# Patient Record
Sex: Female | Born: 1985 | Race: White | Hispanic: No | Marital: Single | Smoking: Current every day smoker
Health system: Southern US, Community
[De-identification: ages and names within clinical notes are randomized; demographics above are authoritative.]

## PROBLEM LIST (undated history)

## (undated) DIAGNOSIS — F410 Panic disorder [episodic paroxysmal anxiety] without agoraphobia: Secondary | ICD-10-CM

## (undated) DIAGNOSIS — F419 Anxiety disorder, unspecified: Secondary | ICD-10-CM

## (undated) HISTORY — PX: DILATION AND CURETTAGE OF UTERUS: SHX78

## (undated) HISTORY — DX: Panic disorder (episodic paroxysmal anxiety): F41.0

## (undated) HISTORY — DX: Anxiety disorder, unspecified: F41.9

## (undated) HISTORY — PX: TUBAL LIGATION: SHX77

---
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
## 2004-01-14 ENCOUNTER — Emergency Department (HOSPITAL_COMMUNITY): Admission: AC | Admit: 2004-01-14 | Discharge: 2004-01-14 | Payer: Self-pay

## 2006-09-01 ENCOUNTER — Emergency Department (HOSPITAL_COMMUNITY): Admission: EM | Admit: 2006-09-01 | Discharge: 2006-09-01 | Payer: Self-pay | Admitting: Emergency Medicine

## 2006-11-16 ENCOUNTER — Emergency Department (HOSPITAL_COMMUNITY): Admission: EM | Admit: 2006-11-16 | Discharge: 2006-11-16 | Payer: Self-pay | Admitting: Emergency Medicine

## 2007-04-09 ENCOUNTER — Encounter: Payer: Self-pay | Admitting: Obstetrics & Gynecology

## 2007-04-09 ENCOUNTER — Inpatient Hospital Stay (HOSPITAL_COMMUNITY): Admission: RE | Admit: 2007-04-09 | Discharge: 2007-04-11 | Payer: Self-pay | Admitting: Obstetrics & Gynecology

## 2007-09-12 ENCOUNTER — Other Ambulatory Visit: Admission: RE | Admit: 2007-09-12 | Discharge: 2007-09-12 | Payer: Self-pay | Admitting: Obstetrics and Gynecology

## 2007-11-08 ENCOUNTER — Ambulatory Visit (HOSPITAL_COMMUNITY): Admission: RE | Admit: 2007-11-08 | Discharge: 2007-11-08 | Payer: Self-pay | Admitting: Obstetrics and Gynecology

## 2007-12-11 ENCOUNTER — Ambulatory Visit (HOSPITAL_COMMUNITY): Admission: RE | Admit: 2007-12-11 | Discharge: 2007-12-11 | Payer: Self-pay | Admitting: Obstetrics and Gynecology

## 2008-01-01 ENCOUNTER — Ambulatory Visit (HOSPITAL_COMMUNITY): Admission: RE | Admit: 2008-01-01 | Discharge: 2008-01-01 | Payer: Self-pay | Admitting: Obstetrics and Gynecology

## 2008-01-15 ENCOUNTER — Ambulatory Visit (HOSPITAL_COMMUNITY): Admission: RE | Admit: 2008-01-15 | Discharge: 2008-01-15 | Payer: Self-pay | Admitting: Obstetrics and Gynecology

## 2008-01-29 ENCOUNTER — Ambulatory Visit (HOSPITAL_COMMUNITY): Admission: RE | Admit: 2008-01-29 | Discharge: 2008-01-29 | Payer: Self-pay | Admitting: Obstetrics and Gynecology

## 2008-02-05 ENCOUNTER — Emergency Department (HOSPITAL_BASED_OUTPATIENT_CLINIC_OR_DEPARTMENT_OTHER): Admission: EM | Admit: 2008-02-05 | Discharge: 2008-02-05 | Payer: Self-pay | Admitting: Emergency Medicine

## 2008-02-19 ENCOUNTER — Ambulatory Visit (HOSPITAL_COMMUNITY): Admission: RE | Admit: 2008-02-19 | Discharge: 2008-02-19 | Payer: Self-pay | Admitting: Obstetrics and Gynecology

## 2008-03-13 ENCOUNTER — Inpatient Hospital Stay (HOSPITAL_COMMUNITY): Admission: AD | Admit: 2008-03-13 | Discharge: 2008-03-13 | Payer: Self-pay | Admitting: Family Medicine

## 2008-03-18 ENCOUNTER — Encounter: Payer: Self-pay | Admitting: Obstetrics & Gynecology

## 2008-03-18 ENCOUNTER — Ambulatory Visit: Payer: Self-pay | Admitting: Obstetrics & Gynecology

## 2008-03-18 ENCOUNTER — Inpatient Hospital Stay (HOSPITAL_COMMUNITY): Admission: RE | Admit: 2008-03-18 | Discharge: 2008-03-20 | Payer: Self-pay | Admitting: Obstetrics and Gynecology

## 2008-03-24 ENCOUNTER — Inpatient Hospital Stay (HOSPITAL_COMMUNITY): Admission: AD | Admit: 2008-03-24 | Discharge: 2008-03-28 | Payer: Self-pay | Admitting: Obstetrics & Gynecology

## 2008-03-24 ENCOUNTER — Encounter: Payer: Self-pay | Admitting: Obstetrics & Gynecology

## 2008-03-24 ENCOUNTER — Ambulatory Visit: Payer: Self-pay | Admitting: Family Medicine

## 2009-06-30 ENCOUNTER — Emergency Department (HOSPITAL_BASED_OUTPATIENT_CLINIC_OR_DEPARTMENT_OTHER): Admission: EM | Admit: 2009-06-30 | Discharge: 2009-06-30 | Payer: Self-pay | Admitting: Emergency Medicine

## 2010-03-28 ENCOUNTER — Encounter: Payer: Self-pay | Admitting: Obstetrics and Gynecology

## 2010-05-25 LAB — URINALYSIS, ROUTINE W REFLEX MICROSCOPIC
Bilirubin Urine: NEGATIVE
Glucose, UA: NEGATIVE mg/dL
Ketones, ur: NEGATIVE mg/dL
Leukocytes, UA: NEGATIVE
Nitrite: NEGATIVE
Protein, ur: NEGATIVE mg/dL
Specific Gravity, Urine: 1.026 (ref 1.005–1.030)
Urobilinogen, UA: 0.2 mg/dL (ref 0.0–1.0)
pH: 6 (ref 5.0–8.0)

## 2010-05-25 LAB — URINE MICROSCOPIC-ADD ON

## 2010-06-03 ENCOUNTER — Emergency Department (HOSPITAL_BASED_OUTPATIENT_CLINIC_OR_DEPARTMENT_OTHER)
Admission: EM | Admit: 2010-06-03 | Discharge: 2010-06-03 | Disposition: A | Discharge status: Home / Self Care | Payer: Medicaid Other | Attending: Emergency Medicine | Admitting: Emergency Medicine

## 2010-06-03 ENCOUNTER — Emergency Department (INDEPENDENT_AMBULATORY_CARE_PROVIDER_SITE_OTHER): Payer: Medicaid Other

## 2010-06-03 DIAGNOSIS — K7689 Other specified diseases of liver: Secondary | ICD-10-CM | POA: Insufficient documentation

## 2010-06-03 DIAGNOSIS — R1011 Right upper quadrant pain: Secondary | ICD-10-CM | POA: Insufficient documentation

## 2010-06-03 DIAGNOSIS — F17200 Nicotine dependence, unspecified, uncomplicated: Secondary | ICD-10-CM | POA: Insufficient documentation

## 2010-06-03 DIAGNOSIS — R110 Nausea: Secondary | ICD-10-CM

## 2010-06-03 DIAGNOSIS — R112 Nausea with vomiting, unspecified: Secondary | ICD-10-CM | POA: Insufficient documentation

## 2010-06-03 LAB — COMPREHENSIVE METABOLIC PANEL
ALT: 43 U/L — ABNORMAL HIGH (ref 0–35)
AST: 31 U/L (ref 0–37)
Albumin: 3.8 g/dL (ref 3.5–5.2)
Alkaline Phosphatase: 59 U/L (ref 39–117)
BUN: 11 mg/dL (ref 6–23)
CO2: 24 mEq/L (ref 19–32)
Calcium: 8.8 mg/dL (ref 8.4–10.5)
Chloride: 110 mEq/L (ref 96–112)
Creatinine, Ser: 0.5 mg/dL (ref 0.4–1.2)
GFR calc Af Amer: >60 mL/min (ref >60)
GFR calc non Af Amer: >60 mL/min (ref >60)
Glucose, Bld: 93 mg/dL (ref 70–99)
Potassium: 3.4 mEq/L — ABNORMAL LOW (ref 3.5–5.1)
Sodium: 143 mEq/L (ref 135–145)
Total Bilirubin: 0.4 mg/dL (ref 0.3–1.2)
Total Protein: 6.9 g/dL (ref 6.0–8.3)

## 2010-06-03 LAB — CBC
HCT: 38.2 % (ref 36.0–46.0)
Hemoglobin: 13.4 g/dL (ref 12.0–15.0)
MCH: 29.3 pg (ref 26.0–34.0)
MCHC: 35.1 g/dL (ref 30.0–36.0)
MCV: 83.4 fL (ref 78.0–100.0)
Platelets: 362 10*3/uL (ref 150–400)
RBC: 4.58 MIL/uL (ref 3.87–5.11)
RDW: 14.5 % (ref 11.5–15.5)
WBC: 10.6 10*3/uL — ABNORMAL HIGH (ref 4.0–10.5)

## 2010-06-03 LAB — DIFFERENTIAL
Basophils Absolute: 0 10*3/uL (ref 0.0–0.1)
Basophils Relative: 0 % (ref 0–1)
Eosinophils Absolute: 0.1 10*3/uL (ref 0.0–0.7)
Eosinophils Relative: 1 % (ref 0–5)
Lymphocytes Relative: 16 % (ref 12–46)
Lymphs Abs: 1.7 10*3/uL (ref 0.7–4.0)
Monocytes Absolute: 0.6 10*3/uL (ref 0.1–1.0)
Monocytes Relative: 5 % (ref 3–12)
Neutro Abs: 8.2 10*3/uL — ABNORMAL HIGH (ref 1.7–7.7)
Neutrophils Relative %: 78 % — ABNORMAL HIGH (ref 43–77)

## 2010-06-03 LAB — URINALYSIS, ROUTINE W REFLEX MICROSCOPIC
Bilirubin Urine: NEGATIVE
Glucose, UA: NEGATIVE mg/dL
Hgb urine dipstick: NEGATIVE
Ketones, ur: NEGATIVE mg/dL
Nitrite: NEGATIVE
Protein, ur: NEGATIVE mg/dL
Specific Gravity, Urine: 1.019 (ref 1.005–1.030)
Urobilinogen, UA: 1 mg/dL (ref 0.0–1.0)
pH: 6 (ref 5.0–8.0)

## 2010-06-03 LAB — LIPASE, BLOOD: Lipase: 45 U/L (ref 23–300)

## 2010-06-03 LAB — PREGNANCY, URINE: Preg Test, Ur: NEGATIVE

## 2010-06-21 LAB — CROSSMATCH: ABO/RH(D): A POS

## 2010-06-21 LAB — PREPARE FRESH FROZEN PLASMA

## 2010-06-21 LAB — CBC
HCT: 23.2 % — ABNORMAL LOW (ref 36.0–46.0)
HCT: 27.8 % — ABNORMAL LOW (ref 36.0–46.0)
HCT: 15.2 % — ABNORMAL LOW (ref 36.0–46.0)
HCT: 22.1 % — ABNORMAL LOW (ref 36.0–46.0)
HCT: 22.7 % — ABNORMAL LOW (ref 36.0–46.0)
Hemoglobin: 7.7 g/dL — CL (ref 12.0–15.0)
Hemoglobin: 9.5 g/dL — ABNORMAL LOW (ref 12.0–15.0)
Hemoglobin: 7.5 g/dL — CL (ref 12.0–15.0)
MCHC: 33.2 g/dL (ref 30.0–36.0)
MCHC: 34.2 g/dL (ref 30.0–36.0)
MCHC: 33.3 g/dL (ref 30.0–36.0)
MCHC: 33.8 g/dL (ref 30.0–36.0)
MCHC: 33.9 g/dL (ref 30.0–36.0)
MCHC: 34.3 g/dL (ref 30.0–36.0)
MCHC: 34.4 g/dL (ref 30.0–36.0)
MCV: 89.5 fL (ref 78.0–100.0)
MCV: 88.8 fL (ref 78.0–100.0)
MCV: 89.1 fL (ref 78.0–100.0)
MCV: 89.8 fL (ref 78.0–100.0)
MCV: 90.4 fL (ref 78.0–100.0)
Platelets: 312 10*3/uL (ref 150–400)
Platelets: 132 10*3/uL — ABNORMAL LOW (ref 150–400)
Platelets: 160 10*3/uL (ref 150–400)
Platelets: 184 10*3/uL (ref 150–400)
RBC: 2.58 MIL/uL — ABNORMAL LOW (ref 3.87–5.11)
RBC: 3.11 MIL/uL — ABNORMAL LOW (ref 3.87–5.11)
RBC: 2.55 MIL/uL — ABNORMAL LOW (ref 3.87–5.11)
RBC: 2.6 MIL/uL — ABNORMAL LOW (ref 3.87–5.11)
RDW: 13.6 % (ref 11.5–15.5)
RDW: 13.8 % (ref 11.5–15.5)
RDW: 13.6 % (ref 11.5–15.5)
RDW: 13.8 % (ref 11.5–15.5)
WBC: 10.6 10*3/uL — ABNORMAL HIGH (ref 4.0–10.5)
WBC: 13.1 10*3/uL — ABNORMAL HIGH (ref 4.0–10.5)
WBC: 9 10*3/uL (ref 4.0–10.5)
WBC: 9.8 10*3/uL (ref 4.0–10.5)

## 2010-06-21 LAB — APTT
aPTT: 32 seconds (ref 24–37)
aPTT: 33 seconds (ref 24–37)
aPTT: 45 seconds — ABNORMAL HIGH (ref 24–37)

## 2010-06-21 LAB — BASIC METABOLIC PANEL
GFR calc Af Amer: >60 mL/min (ref >60)
GFR calc non Af Amer: >60 mL/min (ref >60)
Potassium: 3.5 mEq/L (ref 3.5–5.1)
Sodium: 139 mEq/L (ref 135–145)

## 2010-06-21 LAB — FIBRINOGEN
Fibrinogen: 195 mg/dL — ABNORMAL LOW (ref 204–475)
Fibrinogen: 366 mg/dL (ref 204–475)
Fibrinogen: <60 mg/dL — CL (ref 204–475)

## 2010-06-21 LAB — PREPARE RBC (CROSSMATCH)

## 2010-06-21 LAB — PROTIME-INR: INR: 1.1 (ref 0.00–1.49)

## 2010-06-21 LAB — RPR: RPR Ser Ql: NONREACTIVE

## 2010-07-20 NOTE — Discharge Summary (Signed)
Jasmine Knapp, Jasmine Knapp              ACCOUNT NO.:  [id number]   MEDICAL RECORD NO.:  [id number]          PATIENT TYPE:  INP   LOCATION:  9145                          FACILITY:  [facility]   PHYSICIAN:  Norton Blizzard, MD    DATE OF BIRTH:  Aug 03, 1985   DATE OF ADMISSION:  03/18/2008  DATE OF DISCHARGE:  03/20/2008                               DISCHARGE SUMMARY   ADMISSION DIAGNOSES:  1. Intrauterine pregnancy at 48 and 4 weeks' gestation.  2. History of previous cesarean sections x2.  3. Undesired fertility.   DISCHARGE DIAGNOSES:  Postoperative day #2 from repeat low transverse  cesarean section and bilateral tubal ligation.   PROCEDURE:  Repeat low transverse cesarean section bilateral tubal  ligation performed by Dr. Duane Lope on March 18, 2008.   COMPLICATIONS:  None.   CONSULTATIONS:  None.   LABORATORY DATA:  A positive with antibody screen negative.  RPR was  nonreactive.  CBC on postoperative day #1 showed white blood count of  9.3, hemoglobin of 7.7, hematocrit 23.2, and platelet count 257.   ADMISSION HISTORY:  Jasmine Knapp is a 25 year old gravida 3, para 3-0-0-3  with history of previous cesarean sections x2.  She presented to the  [facility] in [place] for a repeat low transverse cesarean  section as she does not desire trial of labor.   HOSPITAL COURSE:  The patient was admitted and given Ancef 1 g for  infection prophylaxis.  She was then taken for a repeat low transverse  cesarean section and bilateral tubal ligation performed by Dr. Duane Lope on March 18, 2008.  The operation was uncomplicated though  moderate adhesive disease was noted throughout the rectus muscles,  peritoneum, and uterus.  After a 6 pounds 10 ounces baby girl was  delivered, attention was turned to the bilateral tubal ligation.  Due to  adhesive disease of the right fallopian tube,  sterilization of the  right fallopian tube was performed via a fibriomectomy.  Sterilization  of the left fallopian tube was performed via a Pomeroy procedure.  The  patient did well postoperatively.  On postoperative day #1, the patient  was ambulating well, tolerating oral intake and had a decrease in lochia  and vital signs were stable.  Notably, the hemoglobin postoperative day  #1 was 7.7, and iron supplementation was started at 325 mg twice a day.  Physical exam was normal.  On postoperative day #2, the patient desired  early discharge and was discharged in stable condition.   DISCHARGE DIET:  Stable.   DISCHARGE MEDICATIONS:  1. Percocet 5/325 mg 1 tablet by mouth every 4-6 hours as needed for      pain.  2. Ibuprofen 600 mg 1 tablet by mouth every 6 hours as needed for      pain.  3. Colace 100 mg 1 tablet by mouth twice a day as needed for      postpartum constipation.  4. Ferrous sulfate 325 mg 1 tablet by mouth twice a day.   DISCHARGE INSTRUCTIONS:  1. Discharged to home.  2. Regular diet.  3. No  sexual activity or anything entering the vagina for 6 weeks.  4. No lifting greater than 10 pounds for 6 weeks.  5. The patient is to follow up with Dr. Emelda Fear at [place], [facility], for staple removal on March 24, 2008, and at 6 weeks      for her postpartum exam.      Delbert Harness, MD      Norton Blizzard, MD  Electronically Signed    KB/MEDQ  D:  03/20/2008  T:  03/20/2008  Job:  [id number]

## 2010-07-20 NOTE — Discharge Summary (Signed)
[name], Jasmine Knapp              ACCOUNT NO.:  [id number]   MEDICAL RECORD NO.:  [id number]          PATIENT TYPE:  INP   LOCATION:  9308                          [facility]  [facility]   PHYSICIAN:  Tilda Burrow, M.D. DATE OF BIRTH:  March 04, 1986   DATE OF ADMISSION:  03/24/2008  DATE OF DISCHARGE:  03/28/2008                               DISCHARGE SUMMARY   ADMITTING DIAGNOSES:  Late postpartum hemorrhage and anemia.   PROCEDURE:  Transfusion, 8 units packed cells transfusion, 2 units fresh  frozen plasma, postpartum dilation and curettage  performed on March 24, 2008.   DISCHARGE MEDICATIONS:  1. Ibuprofen 600 mg, #30, 1 p.o. q.6 h. p.r.n. pain.  2. Percocet 5/325, 20 tablets, 1 p.o. q.6 h. p.r.n. severe pain.  3. K-Dur 20 mEq 1 p.o. q.a.m. x2 weeks.  4. HCTZ 25 mg, 15 tablets, 1 p.o. q.a.m. x2 weeks.  5. Iron sulfate 60 tablets 1 p.o. b.i.d. x30 days.   FOLLOWUP:  Follow up in one week with [facility].   HOSPITAL SUMMARY:  This is a 25 year old female on postpartum day 6  after a straightforward cesarean section with postpartum hemoglobin of  7.5.  On postop day 1, after a pre-cesarean hemoglobin of 9.7, was  admitted after calling [facility] on the morning of March 24, 2008, complaining of increased bleeding.  She could not come to the  office and presented approximately 5 p.m. to [facility] where  hemoglobin of 5.1 was identified, hematocrit 15.2, and white count  13,000.  IV oxytocin was initiated and uterine tone was good with  increased clots in the uterus.  She was transfused 2 units with  hemoglobin of 7.8 initially post procedure.  She was taken to the  operating room for curettage, which did not identify any placental  remnants, but removed large amounts of clots.  She was responded to  oxytocics and curettage adequately.  She was kept in the ICU with  vigorous resuscitation fluid and blood replacement management over the  first 24 hours.   Hemoglobin on January 19, was 7.7, rechecked, at 7.5  later that day.  After collaboration with hemoglobin 6.6 and hematocrit  19.4, a 2 units of  additional blood given on March 26, 2008, two  additional units packed cells.  Postoperative hemoglobin was 10.0.  She  had shortness of breath and chest x-ray on March 27, 2008, showed mild  pulmonary edema, which responded to Lasix.  Repeat chest x-ray March 28, 2008, showed improvements.  She was unlabored with oxygen saturation  of 97%, the patient considered discharge and stable for discharge home  at that time.   ADDENDUM:  On reviewing records, the patient had a Bakri balloon  overnight after the uterine curettage with good response.      Tilda Burrow, M.D.  Electronically Signed    JVF/MEDQ  D:  03/28/2008  T:  03/29/2008  Job:  [id number]

## 2010-07-20 NOTE — Consult Note (Signed)
Jasmine Knapp, Jasmine Knapp              ACCOUNT NO.:  [id number]   MEDICAL RECORD NO.:  [id number]          PATIENT TYPE:  INP   LOCATION:  9373                          [facility]   PHYSICIAN:  Tanya S. Shawnie Pons, M.D.   DATE OF BIRTH:  04/24/1985   DATE OF CONSULTATION:  DATE OF DISCHARGE:                                 CONSULTATION   HISTORY OF PRESENT ILLNESS:  The patient is a 25 year old gravida 3,  para 3 who is 6 days status post repeat low transverse cesarean section  and BTL who had been discharged home on March 20, 2008, after that  procedure.  Postoperatively, she has done well.  She reports her  bleeding was good until today when she began to acutely hemorrhage.  She  apparently soaked 4 pads en route to this hospital and when she got  here, she was very pale, diaphoretic, and had very few and had very  little in the way of venous access.  The patient was seen in the MAU  where she was acutely evaluated, was found to be probably very anemic.  Her postoperative hemoglobin had been 7.7 five days prior to admission,  and it was unclear where her hemoglobin was as she was obviously still  losing quite a bit of blood.  She had a very enlarged uterus that felt  to be full of clot.  She received Methergine, Pitocin, and Cytotec in  the MAU without significant improvement in her physical exam or output,  so she was taken for emergency surgery.   PAST MEDICAL HISTORY:  Negative.   PAST SURGICAL HISTORY:  C-section x3 and BTL.   ALLERGIES:  None known.   MEDICATIONS:  1. Prenatal vitamins.  2. Percocet for pain.  3. Iron tablets 1 twice a day.  4. Colace 1 twice a day.  5. Ibuprofen 600 mg as needed for pain.   OBSTETRICAL HISTORY:  She has had 3 cesarean sections, also BTL.   GYNECOLOGICAL HISTORY:  Noncontributory.   REVIEW OF SYSTEMS:  Unable to be obtained at the time of admission.   PHYSICAL EXAMINATION:  GENERAL:  The patient is pale, diaphoretic.  She  is in  steep Trendelenburg.  VITAL SIGNS:  Blood pressure is 90/40, pulse is in the 110s.  LUNGS:  Essentially clear.  CARDIAC:  Heart rate is regular with a significant flow murmur noted.  ABDOMEN:  Soft.  The fundus is approximately 1 cm above the umbilicus  and boggy.  Cervix is open, approximately 1 cm with lots of clot noted  at the internal os.  EXTREMITIES:  Cool and clammy.  NEUROLOGICAL:  The patient is intact and answering questions.   PERTINENT LABORATORY DATA:  Could not be obtained.   IMPRESSION:  Acute bleeding, 6 days post cesarean section, profound  anemia, and blood loss shock.   PLAN:  Immediate surgical intervention with a D&C and further  intervention as needed.  The patient was also given 2 units of emergency  blood, and we will continue to try to obtain labs.      Shelbie Proctor. Shawnie Pons, M.D.  Electronically Signed     TSP/MEDQ  D:  03/24/2008  T:  03/25/2008  Job:  [id number]

## 2010-07-20 NOTE — Op Note (Signed)
[name], Jasmine Knapp              ACCOUNT NO.:  [id number]   MEDICAL RECORD NO.:  [id number]          PATIENT TYPE:  INP   LOCATION:  9126                          [facility]  [facility]   PHYSICIAN:  Lazaro Arms, M.D.   DATE OF BIRTH:  14-Nov-1985   DATE OF PROCEDURE:  04/09/2007  DATE OF DISCHARGE:                               OPERATIVE REPORT   PREOPERATIVE DIAGNOSES:  1. Uterine pregnancy at [redacted] weeks gestation.  2. Previous C-section.  3. Declines trial of labor.   POSTOPERATIVE DIAGNOSES:  1. Uterine pregnancy at [redacted] weeks gestation.  2. Previous C-section.  3. Declines trial of labor.   PROCEDURE:  Repeat cesarean section.   SURGEON:  Lazaro Arms, M.D.   ANESTHESIA:  Spinal.   FINDINGS:  Over a low transverse hysterotomy incision was delivered a  viable female infant at 10:53 with Apgars of 8 and 9. The weight was 6  pounds 0 ounces, three-vessel cord, cord blood and cord gas were sent.  There were dense adhesions band of the anterior abdominal wall to the  uterus otherwise uterus, tubes and ovaries normal.   DESCRIPTION OF OPERATION:  The patient was taken to the operating room,  underwent a spinal anesthetic then placed in the supine position with a  tilt to the left side.  She was prepped and draped in the usual sterile  fashion. A Foley catheter was placed. A Pfannenstiel's skin incision was  made, carried down sharply to the rectus fascia, scored in the midline  and extended laterally. The fascia was taken off the muscles superiorly  and inferiorly without difficulty.  The muscles were divided, peritoneal  cavity was entered. At that time dense adhesions basically all the way  to the fundus of the uterus were encountered.  These were taken down  sharply with a knife avoiding the posterior wall of the bladder. So  basically it took a little uterine serosa with it in order to avoid any  bladder damage. This was all the way down to the lower uterine segment.  A lower  transverse incision was then made.  The baby was delivered  without difficulty using a vacuum extractor. The infant was taken to the  neonatologist who was in attendance for routine neonatal resuscitation.  The placenta was delivered spontaneously.  Cord blood and cord gas were  sent.  The cord gas had a pH 7.34. The uterus was closed with two  layers, the first being a running interlocking layer, the second being  an imbricating layer with good hemostasis. Where I took the adhesion of  the uterus was then closed with interrupted Monocryl sutures in figure-  of-eight fashion without difficulty.  There was good hemostasis at this  point. The muscles were reapproximated, the fascia was closed using zero  Vicryl running, subcutaneous  tissues was made hemostatic and irrigated.  The skin was closed using  skin staples.  The patient tolerated the procedure well.  She  experienced 600 mL of blood loss,  taken to the recovery room in stable  condition.  All counts correct x3.  Lazaro Arms, M.D.  Electronically Signed     LHE/MEDQ  D:  04/09/2007  T:  04/09/2007  Job:  [id number]

## 2010-07-20 NOTE — Op Note (Signed)
Jasmine Knapp, Jasmine Knapp              ACCOUNT NO.:  [id number]   MEDICAL RECORD NO.:  [id number]          PATIENT TYPE:  INP   LOCATION:  9145                          [facility]   PHYSICIAN:  Lazaro Arms, M.D.   DATE OF BIRTH:  February 28, 1986   DATE OF PROCEDURE:  DATE OF DISCHARGE:                               OPERATIVE REPORT   PREOPERATIVE DIAGNOSES:  1. Intrauterine pregnancy at term.  2. History of previous cesarean section x2.  3. Undesired fertility.   POSTOPERATIVE DIAGNOSES:  1. Intrauterine pregnancy at term.  2. History of previous cesarean section x2.  3. Undesired fertility.   PROCEDURE:  1. Repeat low transverse cesarean section.  2. Bilateral tubal sterilization procedure.   SURGEON:  Lazaro Arms, MD   ASSISTANT:  Odie Sera, DO   ANESTHESIA:  Spinal.   INDICATIONS FOR PROCEDURE:  [name] is a 25 year old, gravida  3, now para 3-0-0-3, who presents at 39-4/7th weeks gestational age for  an elective repeat cesarean section.  She also desires a tubal  sterilization procedure.  She has previously been counseled on the risks  and benefits of both procedures to include, but not limited to bleeding,  infection, damage to internal organs as well as the failure rate of  approximately 5 in 1000 with the increased risk of a tubal pregnancy in  the event if failure did occur.  She has also been extensively counseled  on the permanency of this procedure.  She voiced understanding of the  above and desires to proceed with the procedures.   DESCRIPTION OF PROCEDURE:  The patient was taken to the operating room  where spinal anesthesia was introduced.  She was then prepped and draped  in the usual sterile manner and placed in the left dorsal supine  position.  Time-out was conducted.  Appropriate anesthesia was  confirmed.  A Pfannenstiel incision was made with a scalpel and  continued down to the fascia.  The fascia was then incised in the  midline  and the fascial incision was extended laterally using the Mayo  scissors.  The fascia was then dissected off the underlying rectus  muscles and moderate adhesive disease was noted.  The rectus muscles  were moderately adhesed to the peritoneum.  The peritoneum was  moderately adhesed to the uterus as well.  The rectus muscles were  entered bluntly in the midline.  The peritoneum was also entered bluntly  as well.  The rectus opening was extended using a Mayo scissors  inferiorly.  The peritoneum opening was also extended inferiorly with  the Mayo scissors.  A moderate-sized adhesion of the peritoneum to the  anterior uterine wall was noted, and this was taken down with a Mayo  scissors.  Appropriate entry to the uterus was obtained, and an incision  was made in the scalpel in the lower uterine segment.  The incision was  carefully carried through the myometrium was the last layer being  penetrated bluntly with a finger.  The uterine incision was then  extended laterally with manual traction.  The head was elevated out  of  the pelvis and delivered through the uterine incision with the  assistance of fundal pressure.  The mouth and nares were bulb suctioned.  The baby was noted to be in the occiput posterior position.  The  shoulders were delivered in the usual manner followed by rest of the  corpus without problem.  The mouth and nares were bulb suctioned again.  The cord was clamped and cut x2, and the baby was handed to the awaiting  NICU staff.  The placenta then delivered spontaneously intact, and a  three-vessel cord was noted.  The uterus was exteriorized and then the  uterine incision was closed.  Cord pH was collected which was 7.3.  The  uterine incision was then closed in the usual manner with a running  interlocking 0 Monocryl suture.  Good hemostasis was noted.  Attention  was then brought to the fallopian tubes.  There was significant adhesive  disease of the right fallopian  tube to the wall the uterus.  Because a  portion of the fallopian tube could not be isolated, the tubal  sterilization procedure on the right side was performed via a  fimbriectomy.  The tube was grasped with a Kelly clamp just proximal to  the fimbria.  The tube was then oversewn in this region with Monocryl  suture.  The fimbria was then completely dissected with Metzenbaum  scissors.  The dissected portion of fimbria was sent to pathology.  Attention was then brought to the left fallopian tube.  A portion of the  tube was grasped with a Babcock clamp, and the tubal ligation was  performed using the modified Pomeroy procedure in the usual manner.  A  portion of the left fallopian tube was dissected with Metzenbaum  scissors and sent to pathology.  A small area of oozing was noted on the  anterior uterine wall in the area where the adhesion was taken down.  This area was treated with a figure-of-eight using 0 Monocryl suture.  Good hemostasis was then noted in both of that area as well as the  uterine incision.  The uterus was then returned to the abdomen.  The  abdomen was irrigated copiously with sterile water.  The fascia was then  closed in the usual manner using 0 Monocryl suture.  The subcutaneous  tissues were irrigated.  Good hemostasis was noted.  There were no  defects noted in the fascial closure.  The skin was then closed with  staples in the usual manner and a pressure dressing was applied.   FINDINGS:  1. Clear amniotic fluid.  2. Viable female infant, weight 6 pounds 12 ounces.  3. Moderate pelvic adhesive disease.   SPECIMENS:  1. Placenta.  2. Right fimbria.  3. Left fallopian tube portion.   DISPOSITION OF SPECIMENS:  To pathology.   ESTIMATED BLOOD LOSS:  700 mL.   COMPLICATIONS:  None immediate.   The patient was taken to the PACU in good condition.      Odie Sera, DO  Electronically Signed     ______________________________  Lazaro Arms,  M.D.    MC/MEDQ  D:  03/18/2008  T:  03/19/2008  Job:  [id number]

## 2010-07-20 NOTE — Consult Note (Signed)
NAMEANNELISE, Jasmine Knapp              ACCOUNT NO.:  [id number]   MEDICAL RECORD NO.:  [id number]          PATIENT TYPE:  EMS   LOCATION:  ED                            FACILITY:  [facility]   PHYSICIAN:  Tilda Burrow, M.D. DATE OF BIRTH:  01-03-1986   DATE OF CONSULTATION:  09/01/2006  DATE OF DISCHARGE:                                 CONSULTATION   HISTORY OF PRESENT ILLNESS:  This 25 year old gravida 2, para 1, LMP 7  weeks ago, is seen in the ER for right lower quadrant discomfort.  She  has had no bleeding, spotting, syncope or abdominal discomfort.  She  complains of right lower quadrant discomfort for the last 3-4 days.  Evaluation by Ms. Triplett, P.A., has been performed and documented  elsewhere.  She is seen by me in consultation.   Abdomen is nontender, is without guarding or rebound, there is vague 1+  right lower quadrant discomfort.   Transabdominal ultrasound is attempted by Dr. Jannifer Franklin and is  unsuccessful due to transmission in empty bladder.  Early gestation is  suspected but cannot be confirmed.  Transvaginal ultrasound is performed  which shows an anteflexed uterus with a single intrauterine gestational  sac with yolk sac clearly visible and a 3 mm fetal pole with fetal heart  motion noted, consistent with approximately 5-1/2 to [redacted] weeks gestation.  The left adnexa is completely normal with small ovary seen.  The right  ovary has a suspected corpus luteum cyst of 3 cm diameter in a 4.5 cm  ovary.  Palpation of the abdomen over the cyst confirms that this is the  source of her discomfort.   IMPRESSION:  Right lower quadrant discomfort associated with normal  corpus luteum cyst.  Intrauterine pregnancy 5-1/2 to [redacted] weeks gestation.   ADDENDUM:  The patient states she normally has 5-week cycles which would  correlate with her EDC being approximately 1 week later than would be  anticipated on menstrual history.   Routine followup as previously scheduled in  [facility] September 05, 2006.      Tilda Burrow, M.D.  Electronically Signed     JVF/MEDQ  D:  09/01/2006  T:  09/01/2006  Job:  [id number]   cc:   [facility]

## 2010-07-20 NOTE — H&P (Signed)
Jasmine Knapp, JUNIO              ACCOUNT NO.:  [id number]   MEDICAL RECORD NO.:  [id number]          PATIENT TYPE:  INP   LOCATION:  9126                          [facility]ACILITY[facility]   PHYSICIAN:  Lazaro Arms, M.D.   DATE OF BIRTH:  02-20-1986   DATE OF ADMISSION:  04/09/2007  DATE OF DISCHARGE:                              HISTORY & PHYSICAL   HISTORY OF PRESENT ILLNESS:  Jasmine Knapp is a 25 year old white female,  gravida 2, para 1, at [redacted] weeks gestation today, who is admitted for a  repeat cesarean section.  She had a previous C-section three years ago  and declines a trial of labor.  She had an emergency C-section at that  time for fetal distress.  Pregnancy has been relatively uncomplicated.  She has had kidney stones and is Group B strep positive, otherwise is  negative.   PAST MEDICAL HISTORY:  Negative.   PAST SURGICAL HISTORY:  C-section.   PAST OBSTETRICAL HISTORY:  C-section in 2005, three and a half years  ago, 3 pounds 9 and 1/2 ounces, emergency for fetal distress.   ALLERGIES:  None.   MEDICATIONS:  Prenatal vitamins.   REVIEW OF SYSTEMS:  Negative.   PHYSICAL EXAMINATION:  HEENT:  Unremarkable.  Thyroid is normal.  LUNGS:  Clear.  HEART:  Regular rate and rhythm without regurg, or gallop.  BREASTS:  Deferred.  ABDOMEN:  Fundal height of 36-cm.  PELVIC:  Cervix is long thick and closed.  EXTREMITIES:  Warm with no edema.  NEUROLOGIC:  Grossly intact.   Blood type is A positive.  Varicella is immune.  Rubella immune.  Urine  drug screen is positive for THC x1.  Hepatitis B was negative.  HIV was  nonreactive x2.  HSV-2 was negative.  HPV was negative.  Serology was  nonreactive x2.  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  AFP was normal.  Group B strep positive.  Glucola 131.   IMPRESSION:  1. Intrauterine pregnancy at 63 weeks' gestation.  2. Previous cesarean section.  3. Declines trial of labor.   PLAN:  The  patient is admitted for a repeat cesarean section.  She  understands the risks, benefits, indications, alternatives and will  proceed.      Lazaro Arms, M.D.  Electronically Signed     LHE/MEDQ  D:  04/09/2007  T:  04/09/2007  Job:  [id number]

## 2010-07-20 NOTE — Op Note (Signed)
NAMEDINA, WARBINGTON              ACCOUNT NO.:  [id number]   MEDICAL RECORD NO.:  [id number]          PATIENT TYPE:  INP   LOCATION:  9373                          FACILITY:  [facility]   PHYSICIAN:  Tanya S. Shawnie Pons, M.D.   DATE OF BIRTH:  [birth year]   DATE OF PROCEDURE:  DATE OF DISCHARGE:                               OPERATIVE REPORT   PREOPERATIVE DIAGNOSES:  Acute bleeding, 6 days' post cesarean section,  possible retained products of conception, and shock.   POSTOPERATIVE DIAGNOSES:  Acute bleeding, 6 days' post cesarean section,  possible retained products of conception, and shock.   PROCEDURE:  D and C, backbleed balloon placement, and vaginal packing.   SURGEON:  Shelbie Proctor. Shawnie Pons, MD   ASSISTANT:  Zerita Boers, NM   ANESTHESIA:  Spinal.   Findings include enlarged uterus, open os approximately 1 cm, uterus  full of blood, and moderate amount of tissue removed.   SPECIMEN:  Uterine contents to Pathology.   Estimated blood loss was approximately 1500 mL total in the OR.   COMPLICATIONS:  None immediately known.   REASON FOR PROCEDURE:  Briefly, the patient is a 25 year old, gravida 3,  para 3, who is 6 days' post cesarean section and BTL, who came in today  with acute vaginal bleeding and blood loss.  She was found to be  profoundly shocky and very anemic as well.  She is very pale,  diaphoretic, and had unstable vital signs, and was taken for immediate  intervention.   PROCEDURE:  The patient was taken to the OR, where she was given spinal  analgesia.  After that, she was prepped and draped in the usual sterile  fashion.  Red rubber catheter was used to drain her bladder.  Two units  of packed cells were hung and rapidly infused.  Two more units were  ordered plus FFP.  The uterus was further dilated and then sounded to  approximately 12.  A 10-French curved suction curette was passed about 6  times with lots of blood and tissue being removed.  The trap had to be  changed at least 3 times as it continued to get full and suction no  longer worked.  Following this, sharp curettage with a large banjo-type  curette was done several times and alternating between suction curette  and sharp curettage several times.  The instruments were removed from  the vagina, and bimanual massage showed fairly firm uterus.  The patient  was given Methergine again as well as Cytotec in the OR; however, she  continued to have a fairly significant bleed from the uterus.  It  appeared that the patient might be in VSD if she was not clotting, so  FFP was ordered; however, the patient still had not gotten blood  obtained as she had very little in the way of venous access.  However,  given that sharp and suction curettage could not adequately stop the  bleeding and that she likely had inability to clot, it was felt she  would best be served by placement of a backbleed balloon.  This was  placed  under direct visualization and 480 mL were used to blow up the  balloon.  Initially, quite a bit of blood came back out of the tube;  however, once the balloon was fully inflated, very little blood came  back out.  The fundus was then marked right at the umbilicus, so we  could see if it was expanding.  The backbleed balloon was hooked up to a  Foley catheter to see how much blood was coming back out of the uterus.  The vagina was packed with 2-inch packing to make sure the balloon  stayed in place, and a Foley catheter was placed inside the bladder.  The patient was stable in terms of vital signs on the way out of the OR  and several more units will be given in PACU.  Additionally, several  units of FFP will be done, and blood was essentially drawn; however,  results of this are unavailable at the time of this dictation.  These  labs will be sent, and the patient will be sent to ICU.  All instrument  and lap counts were correct x2.  The patient was awake and taken to  recovery in  stable condition.      Shelbie Proctor. Shawnie Pons, M.D.  Electronically Signed     TSP/MEDQ  D:  03/24/2008  T:  03/25/2008  Job:  [id number]

## 2010-07-23 NOTE — Discharge Summary (Signed)
Jasmine Knapp, Jasmine Knapp              ACCOUNT NO.:  [id number]   MEDICAL RECORD NO.:  [id number]          PATIENT TYPE:  INP   LOCATION:  9126                          [facility]ACILITY:  [facility]   PHYSICIAN:  Karlton Lemon, MD      DATE OF BIRTH:  Jan 28, 1986   DATE OF ADMISSION:  04/09/2007  DATE OF DISCHARGE:  04/11/2007                               DISCHARGE SUMMARY   ADMISSION DIAGNOSES:  1. Intrauterine pregnancy at 78 weeks' gestation.  2. History of previous cesarean section.  3. Declines trial of labor.   DISCHARGE DIAGNOSIS:  Postoperative day #2 from repeat low transverse  cesarean section.   PROCEDURES:  The patient had a repeat low transverse cesarean section  performed by Dr. Duane Lope on April 09, 2007.   COMPLICATIONS:  None.   CONSULTATIONS:  None.   PERTINENT LABORATORY FINDINGS:  The patient on admission had complete  blood count showing a white blood cell count of 12.8, hemoglobin 11.4,  hematocrit 32.0, and platelets 354.  Type and screen was A positive with  antibody screen negative.  RPR was nonreactive.  CBC on postoperative  day #1 showed hemoglobin 9.0, hematocrit 25.3, platelets 258, and white  blood cell count 11.7.  On postoperative day #2, she had a repeat CBC  showing hemoglobin 9.2 hematocrit 26.6, platelets 295, and white blood  cells 10.8.   BRIEF PERTINENT ADMISSION HISTORY:  Jasmine Knapp is a 25 year old gravida  2, para 1-0-0-1 with history of previous C-section x1.  She presents for  repeat low transverse cesarean section as she does not desire trial of  labor.   HOSPITAL COURSE:  The patient was admitted, taken for repeat low  transverse cesarean section by Dr. Duane Lope on April 09, 2007.  The  operation was uncomplicated though adhesions were noted densely all the  way to the uterine fundus.  The patient did well postoperatively, and on  postoperative day #2, vitals were stable, she was ambulating, tolerating  p.o., and had decreased lochia.   Physical examination was normal.  She  desired early discharge on postoperative day #2 and was discharged in  stable condition.   DISCHARGE DIET:  Stable.   DISCHARGE MEDICATIONS:  1. Percocet 5/325 one tablet every 6 hours as needed for pain.  2. Prenatal vitamins 1 tablet by mouth daily.  3. Ibuprofen 600 mg 1 tablet every 6 hours as needed for pain.  4. Colace 100 mg 1 tablet by mouth twice daily.  5. The patient desires Ortho Evra patch for birth control.   DISCHARGE INSTRUCTIONS:  1. Discharged to home.  2. Regular diet.  3. No sexual activity or anything in the vagina for 6 weeks.  4. No lifting greater than 10 pounds for 6 weeks.  5. The patient is to follow up with [facility] for staple      removal and at 6 weeks for postpartum examination.      Karlton Lemon, MD  Electronically Signed     NS/MEDQ  D:  06/07/2007  T:  06/08/2007  Job:  [id number]

## 2010-07-23 NOTE — Discharge Summary (Signed)
NAME:  Jasmine Knapp, Jasmine Knapp                        ACCOUNT NO.:  [id number]   MEDICAL RECORD NO.:  [id number]                   PATIENT TYPE:  INP   LOCATION:  A428                                 FACILITY:  [facility]   PHYSICIAN:  Tilda Burrow, M.D.              DATE OF BIRTH:  [birth year]   DATE OF ADMISSION:  08/08/2003  DATE OF DISCHARGE:  08/12/2003                                 DISCHARGE SUMMARY   ADMISSION DIAGNOSIS:  Pregnancy 36 weeks, 5 days. Nonreassuring fetal status  with prolonged deceleration.   DISCHARGE DIAGNOSES:  1. Pregnancy 36 weeks, 5 days, delivered.  2. Intrauterine growth retardation, uncertain fetal status.   PROCEDURE:  Emergent primary low transverse cervical cesarean section by  Langley Gauss.   FOLLOW UP:  One week staple removal in our office.   HOSPITAL SUMMARY:   HISTORY OF PRESENT ILLNESS:  This 25 year old prima gravida at [redacted] weeks  gestation was admitted after being evaluated by Dr. Langley Gauss on the  evening of August 08, 2003 after presenting to labor and delivery complaining  of a dark brown discharge.  Fetal monitoring showed baseline of 140  beats/minute with normal long term variability with criteria for  acceleration as noted but a deep prolonged deceleration noted to 70  beats/minute with gradual return to baseline during fetal monitoring.  She  was therefore taken for contraction stress test at that time.  She was  evaluated further and recommended for cesarean delivery.  The patient was  taken for cesarean section on the evening of admission August 08, 2003.  An  emergent  cesarean section was performed delivering a 3 pound 9 ounce female  infant with Dr. Milford Cage as pediatrician.  There was findings of nuchal cord x2.  Infant Apgar of 5 and 9 were assigned.  Estimated blood loss was 600 mL.  Pathology report shows a 355 g placenta described as mature with mild acute  chorionitis with multiple placental infarcts, less than 10% of placenta  volume involved.   Postoperatively, the patient did well, tolerated a regular diet within two  days.  Had a postoperative hemoglobin of 11.8, hematocrit 33.8 and  subsequent hemoglobin of 10.5 and 29.7, hematocrit on subsequent postop day.  The arterial blood gas at time of delivery was recorded at 7.280, Pco2 60,  Po2 6.0.  Bicarbonate level was 27.5.   The infant has required routine postop care.  Has grown well since delivery  and patient was discharged to hotel status on August 12, 2003 for followup in  five days in our office for staple removal.     ___________________________________________                                         Tilda Burrow, M.D.   JVF/MEDQ  D:  08/21/2003  T:  08/22/2003  Job:  [id number]

## 2010-07-23 NOTE — Op Note (Signed)
NAME:  Jasmine Knapp, Jasmine Knapp                        ACCOUNT NO.:  [id number]   MEDICAL RECORD NO.:  [id number]                   PATIENT TYPE:  INP   LOCATION:  A428                                 FACILITY:  [facility]   PHYSICIAN:  Langley Gauss, M.D.                DATE OF BIRTH:  Aug 13, 1985   DATE OF PROCEDURE:  08/09/2003  DATE OF DISCHARGE:                                 OPERATIVE REPORT   DIAGNOSES:  1. A 37-week intrauterine pregnancy  2. Spontaneous variable decelerations.   PROCEDURES:  Emergent, though not STAT primary low transverse cesarean  section delivered as a 3 pound 9 ounce female infant.   SURGEON:  Langley Gauss, M.D.   ESTIMATED BLOOD LOSS:  600 ml.   ANESTHESIA:  Spinal.   COMPLICATIONS:  None.   SPECIMENS:  Arterial cord gas and cord blood to pathology laboratory.  Placenta examined and noted to be apparently intact with a three-vessel  umbilical cord, although the umbilical cord is noted to be short.   Additional findings at time of delivery include a thin umbilical cord with a  taut nuchal cord x 2 which is reduced.  In addition, diagnosis of  intrauterine growth restriction is made following delivery.  Drains:  Foley  catheter was placed to straight drainage.  A Jackson-Pratt catheter was  placed in the subcutaneous space.   SUMMARY:  The patient presented p.m. of August 08, 2003, complaining of brown  discharge only.  She was noted to have a variable type deceleration on  external fetal monitor; thus, a contraction stress test was performed.  The  CST was noted to be equivocal with no late decelerations noted, although  some variable type decelerations were noted.  Thus at that point in time,  the plan was patient was continued on continuous electronic fetal monitoring  throughout the evening, and the patient was to be scheduled for a  biophysical profile and an ultrasound to be performed at [facility] a.m. of August 09, 2003, to assess fetal  growth.   Subsequently during the period of monitoring during the evening, the patient  abruptly began having recurrence of the more severe variable type  decelerations down to 80 to 90 beats per minute with one as low as 60  lasting upwards of 45 seconds to 1 minute.  These were noted to be occurring  in the absence of uterine activity.  Thus decision was made to proceed with  emergent primary low transverse cesarean section.   The patient was hurriedly readied.  She was taken down to the operating room  where a spinal analgesic was administered without complications.  The  patient was placed on the OR table at slight left lateral tilt, prepped and  draped in usual sterile manner.  After assurance of adequate surgical  analgesia, knife was used to incise Pfannenstiel incision through the skin,  dissecting down to the fascial plan.  Fascia then incised in transverse  curvilinear manner utilizing the Mayo scissors.  This then bluntly dissected  off the underlying rectus muscles.  Rectus muscles bluntly separately.  Peritoneal cavity atraumatically bluntly entered.  Peritoneal incision  extended superiorly and inferiorly.  Inferior bladder blade is placed.  Lower uterine segment identified.  Bladder flap is created from  vesicouterine fold.  Bladder flap is then bluntly separated the neovascular  plane, pushing it down below the uterine segment.  A knife is then used to  score a low transverse uterine incision.  Intact amniotic sac and __________  are in the midline.  Index finger used to extend the uterine incision  bilaterally.  Ellis clamp is used to artificially rupture the membranes  finding clear amniotic fluid.  Fetal vertex flexed and elevated to the level  of the uterine incision.  Disposable suction connected to wall suction and  placed on infant's vertex.  Gentle traction applied and combined with fundal  pressure results in easy delivery.  Mouth and nares bulb suctioned of  clear  amniotic fluid.  Nuchal cord x 2 is reduce.  Remainder of the infant  likewise delivered without complication.  The umbilical cord is towards the  infant.  Cord is doubly clamped and cut.  Infant is handed to awaiting  pediatrician, Dr. Vivia Ewing.  Arterial cord gas and cord blood obtained.  Gentle traction on umbilical cord results in separation. What appears to be  intact three-vessel placenta, though noted to be small, and is sent to  pathology laboratory.  Uterus is exteriorized.  Anterior exploration reveals  no retained placenta fragments.  Incision is not extended.  This is easily  closed utilizing 0 chromic in running locked fashion, second layer being an  imbricating layer.  Tubes and ovaries noted to be normal in appearance.  Cul-  de-sac irrigated free of all clots.  Uterus returned to the pelvic cavity.  Sponge and instrument counts correct x 2 at this point.  Peritoneum is  closed with a continuous running 0 chromic suture.  Fascia is closed with a  continuous running #1 PDS suture.  Jackson-Pratt drain is placed in  subcutaneous space.  Sutures in place, three horizontal mattress sutures, #1  PDS placed to help bring together skin edges.  The skin is then completely  closed utilizing skin staples.  The patient continues to drain clear yellow  urine.  Operative findings discussed with the patient's awaiting family on  the fourth floor.  Dr. Vivia Ewing in attendance for the operative  procedure.      ___________________________________________                                            Langley Gauss, M.D.   DC/MEDQ  D:  08/10/2003  T:  08/10/2003  Job:  [id number]

## 2010-07-23 NOTE — H&P (Signed)
NAME:  Jasmine Knapp, Jasmine Knapp                        ACCOUNT NO.:  [id number]   MEDICAL RECORD NO.:  [id number]                   PATIENT TYPE:  OIB   LOCATION:  A428                                 FACILITY:  [facility]   PHYSICIAN:  Langley Gauss, M.D.                DATE OF BIRTH:  November 07, 1985   DATE OF ADMISSION:  08/08/2003  DATE OF DISCHARGE:                                HISTORY & PHYSICAL   This is an 25 year old gravida 1, para 0 at 36-5/[redacted] weeks gestation who  presents to [facility] with a chief complaint of a dark brown  discharge.  She has received prenatal care through the office of [facility].  She does have a history of positive Chlamydia, which was treated.  She did require treatment with Reglan and Zantac during the pregnancy.  She  also had an outbreak of .  She denied the use of steroids for this  condition.  The patient subsequently has no other medical or surgical  history.  She has had serial ultrasounds which have documented that of  adequate fetal growth with no findings of placenta previa.  I was only able  to locate an ultrasound, which had been done at [redacted] weeks gestation.   PHYSICAL EXAMINATION:  VITAL SIGNS:  98.5, 80, 18, 125/73.  GENERAL:  Patient is placed on the external fetal monitor.  Noted is fetal  heart rate baseline of 140 with normal long-term variability.  She nearly  meets criteria for acceleration but subsequently in the absence of uterine  activity was noted to have an apparent fetal heart rate deceleration, very  slow in its descent, with a nadir down to 70 beats per minute and a return  to the baseline.  There may or may not be uterine activity present during  this course.   ASSESSMENT/PLAN:  Patient with fetal heart rate deceleration on external  fetal monitor.  Around discharge, I will examine her cervix.  Subsequently,  the patient clearly would be indicated for performance of a contraction  stress test at this time, and if a  contraction stress test is positive, can  proceed with delivery.     ___________________________________________                                         Langley Gauss, M.D.   DC/MEDQ  D:  08/08/2003  T:  08/08/2003  Job:  [id number]

## 2010-07-23 NOTE — H&P (Signed)
NAME:  Jasmine Knapp, Jasmine Knapp                        ACCOUNT NO.:  [id number]   MEDICAL RECORD NO.:  [id number]                   PATIENT TYPE:  INP   LOCATION:  A428                                 FACILITY:  [facility]   PHYSICIAN:  Langley Gauss, M.D.                DATE OF BIRTH:  1985-06-24   DATE OF ADMISSION:  08/08/2003  DATE OF DISCHARGE:                                HISTORY & PHYSICAL   An 25 year old gravida 1, para 0, at [redacted] weeks gestation presents to [facility] with chief complaint of dark brown discharge.  She denies any  significant uterine contractions. She does, however, state that she had  decreased fetal movement throughout today.  The patient's prenatal course by  patient report has been uncomplicated.  She does state that she had an  ultrasound done three weeks ago at which time she was informed that  everything was fine.  She has had no previous visits to labor and delivery.   She is noted to be GBS negative.  She is known to have a history of positive  Chlamydia which was treated.  Has taken Reglan and Zantac for reflux-type  symptoms, also has suffered constipation, has experienced __________ during  the pregnancy.   PHYSICAL EXAMINATION:  GENERAL:  No acute distress.  VITAL SIGNS:  See nursing flow chart.  HEENT:  Negative.  NECK:  No adenopathy.  Neck is supple, nonpalpable.  LUNGS:  Clear.  CARDIOVASCULAR:  Regular rate and rhythm.  ABDOMEN:  Soft and nontender.  Vertex presentation by Leopold's maneuvers.  Fundal height 35 cm.  PELVIC:  External genitalia within normal limits.  No bleeding noted.  No  leakage of fluid.  Examination reveals cervix to be 1 cm dilated, -2  station, about 50% effaced.  The cervix is noted to be far posterior.  EXTREMITIES:  Normal.   IMPRESSION:  External fetal monitor:  No uterine activity identified.  There  is a reactive nonstress test with normal long-term variability; however,  noted on the initial monitor strip is  a fetal heart rate deceleration to 80  to 90 beats per minute x 45 seconds duration.  This does not appear to be  associated with any uterine activity.  Thus a contraction stress test was  called for.   The Pitocin was initiated at 2 ml per minute, increased every 30 minutes to  achieve a contraction pattern of 3 in 10 minutes time.  The patient did not  have any late decelerations during the CST.  She did, however, have some  variable type decelerations.  Thus, interpretation is that of equivocal  contraction stress test.   PLAN:  Continue the patient on external fetal monitor throughout the  evening.  Ultrasound, BTT, and gestational age to be performed by department  of radiology on August 09, 2003.     ___________________________________________  Langley Gauss, M.D.   DC/MEDQ  D:  08/10/2003  T:  08/10/2003  Job:  [id number]   cc:   [facility]

## 2010-11-26 LAB — TYPE AND SCREEN
ABO/RH(D): A POS
Antibody Screen: NEGATIVE

## 2010-11-26 LAB — CBC
MCHC: 34.7
MCHC: 35.5
Platelets: 258
RBC: 2.88 — ABNORMAL LOW
RBC: 3.49 — ABNORMAL LOW
RDW: 13.6
WBC: 10.8 — ABNORMAL HIGH
WBC: 12.8 — ABNORMAL HIGH

## 2010-11-26 LAB — ABO/RH: ABO/RH(D): A POS

## 2010-11-26 LAB — RPR: RPR Ser Ql: NONREACTIVE

## 2010-12-07 ENCOUNTER — Emergency Department (HOSPITAL_BASED_OUTPATIENT_CLINIC_OR_DEPARTMENT_OTHER)
Admission: EM | Admit: 2010-12-07 | Discharge: 2010-12-07 | Disposition: A | Discharge status: Home / Self Care | Payer: Self-pay | Attending: Emergency Medicine | Admitting: Emergency Medicine

## 2010-12-07 ENCOUNTER — Encounter: Payer: Self-pay | Admitting: *Deleted

## 2010-12-07 ENCOUNTER — Emergency Department (INDEPENDENT_AMBULATORY_CARE_PROVIDER_SITE_OTHER): Payer: Self-pay

## 2010-12-07 DIAGNOSIS — R0989 Other specified symptoms and signs involving the circulatory and respiratory systems: Secondary | ICD-10-CM

## 2010-12-07 DIAGNOSIS — J40 Bronchitis, not specified as acute or chronic: Secondary | ICD-10-CM | POA: Insufficient documentation

## 2010-12-07 DIAGNOSIS — R059 Cough, unspecified: Secondary | ICD-10-CM | POA: Insufficient documentation

## 2010-12-07 DIAGNOSIS — R070 Pain in throat: Secondary | ICD-10-CM

## 2010-12-07 DIAGNOSIS — J189 Pneumonia, unspecified organism: Secondary | ICD-10-CM

## 2010-12-07 DIAGNOSIS — R05 Cough: Secondary | ICD-10-CM

## 2010-12-07 MED ORDER — AZITHROMYCIN 250 MG PO TABS
500.0000 mg | ORAL_TABLET | Freq: Once | ORAL | Status: AC
  Administered 2010-12-07: 500 mg via ORAL
  Filled 2010-12-07: qty 2

## 2010-12-07 MED ORDER — AZITHROMYCIN 250 MG PO TABS: 250.0000 mg | ORAL_TABLET | Freq: Every day | ORAL | Status: AC

## 2010-12-07 MED ORDER — HYDROCOD POLST-CHLORPHEN POLST 10-8 MG/5ML PO LQCR: 5.0000 mL | Freq: Two times a day (BID) | ORAL | Status: DC

## 2010-12-07 NOTE — ED Notes (Signed)
Cough. URI x 2 days. Is not getting better. Panic attacks x the past week.

## 2010-12-07 NOTE — ED Provider Notes (Signed)
History     CSN: [id number] Arrival date & time: 12/07/2010  6:45 PM  Chief Complaint  Patient presents with  . URI    (Consider location/radiation/quality/duration/timing/severity/associated sxs/prior treatment) HPI Comments: Pt states that she has had a cold for 2 weeks and then a cough started the last couple of days:pt states that she is getting panic attacks more frequently  Patient is a 25 y.o. female presenting with URI. The history is provided by the patient.  URI The primary symptoms include sore throat and cough. Primary symptoms do not include nausea or vomiting. The current episode started more than 1 week ago. This is a new problem. The problem has not changed since onset. Symptoms associated with the illness include congestion. The illness is not associated with sinus pressure.    History reviewed. No pertinent past medical history.  History reviewed. No pertinent past surgical history.  No family history on file.  History  Substance Use Topics  . Smoking status: Current Everyday Smoker  . Smokeless tobacco: Not on file  . Alcohol Use: Yes    OB History    Grav Para Term Preterm Abortions TAB SAB Ect Mult Living                  Review of Systems  HENT: Positive for congestion and sore throat. Negative for sinus pressure.   Respiratory: Positive for cough.   Gastrointestinal: Negative for nausea and vomiting.  All other systems reviewed and are negative.    Allergies  Review of patient's allergies indicates no known allergies.  Home Medications  No current outpatient prescriptions on file.  BP 115/76  Pulse 102  Temp(Src) 98.3 F (36.8 C) (Oral)  Resp 22  SpO2 99%  Physical Exam  Nursing note and vitals reviewed. Constitutional: She is oriented to person, place, and time. She appears well-developed and well-nourished.  HENT:  Head: Normocephalic and atraumatic.  Right Ear: External ear normal.  Left Ear: External ear normal.  Nose:  Rhinorrhea present.  Mouth/Throat: Posterior oropharyngeal erythema present.  Eyes: Pupils are equal, round, and reactive to light.  Cardiovascular: Normal rate and regular rhythm.   Pulmonary/Chest: Effort normal and breath sounds normal.  Abdominal: Soft. Bowel sounds are normal.  Musculoskeletal: Normal range of motion.  Neurological: She is alert and oriented to person, place, and time.  Skin: Skin is warm and dry.  Psychiatric: She has a normal mood and affect.    ED Course  Procedures (including critical care time)  Labs Reviewed - No data to display Dg Chest 2 View  12/07/2010  *RADIOLOGY REPORT*  Clinical Data: Cough and congestion.  CHEST - 2 VIEW  Comparison: 03/28/2008  Findings: There is an infiltrate in the right lower lobe.  No edema or pleural effusion.  Heart size is normal.  The bony thorax is unremarkable.  IMPRESSION: Right lower lobe infiltrate.  Original Report Authenticated By: Reola Calkins, M.D.     1. Community acquired pneumonia       MDM  Pt in no acute distress, vs stable:will send home on antibiotics       Teressa Lower, NP 12/07/10 1921  Teressa Lower, NP 12/07/10 1921

## 2010-12-10 NOTE — ED Provider Notes (Signed)
Medical screening examination/treatment/procedure(s) were performed by non-physician practitioner and as supervising physician I was immediately available for consultation/collaboration.  Deveney Bayon, MD 12/10/10 1626 

## 2010-12-17 LAB — URINE MICROSCOPIC-ADD ON

## 2010-12-17 LAB — URINALYSIS, ROUTINE W REFLEX MICROSCOPIC
Glucose, UA: NEGATIVE
Protein, ur: NEGATIVE
pH: 5.5

## 2010-12-22 LAB — URINALYSIS, ROUTINE W REFLEX MICROSCOPIC
Hgb urine dipstick: NEGATIVE
Specific Gravity, Urine: 1.02
Urobilinogen, UA: 0.2
pH: 8

## 2011-09-28 ENCOUNTER — Emergency Department (HOSPITAL_COMMUNITY)
Admission: EM | Admit: 2011-09-28 | Discharge: 2011-09-29 | Disposition: A | Discharge status: Home / Self Care | Payer: Medicaid Other | Attending: Emergency Medicine | Admitting: Emergency Medicine

## 2011-09-28 ENCOUNTER — Encounter (HOSPITAL_COMMUNITY): Payer: Self-pay | Admitting: Emergency Medicine

## 2011-09-28 DIAGNOSIS — F419 Anxiety disorder, unspecified: Secondary | ICD-10-CM

## 2011-09-28 DIAGNOSIS — F411 Generalized anxiety disorder: Secondary | ICD-10-CM | POA: Insufficient documentation

## 2011-09-28 DIAGNOSIS — R0789 Other chest pain: Secondary | ICD-10-CM | POA: Insufficient documentation

## 2011-09-28 DIAGNOSIS — F17200 Nicotine dependence, unspecified, uncomplicated: Secondary | ICD-10-CM | POA: Insufficient documentation

## 2011-09-28 LAB — CBC
HCT: 38.5 % (ref 36.0–46.0)
Hemoglobin: 13.3 g/dL (ref 12.0–15.0)
MCH: 29.6 pg (ref 26.0–34.0)
MCV: 85.6 fL (ref 78.0–100.0)
RBC: 4.5 MIL/uL (ref 3.87–5.11)

## 2011-09-28 MED ORDER — ALPRAZOLAM 0.5 MG PO TABS: 0.2500 mg | ORAL_TABLET | Freq: Once | ORAL | Status: DC

## 2011-09-28 NOTE — ED Notes (Signed)
Pt complains of dizziness, fatique and general malaise for sever weeks. Also notes some chest pain that started about an hour ago. States it is in her mid chest and is a pressure. Pain 4/10. States she also feels like her heart is racing.

## 2011-09-29 LAB — BASIC METABOLIC PANEL
BUN: 7 mg/dL (ref 6–23)
CO2: 25 mEq/L (ref 19–32)
Calcium: 9.7 mg/dL (ref 8.4–10.5)
Creatinine, Ser: 0.66 mg/dL (ref 0.50–1.10)
Glucose, Bld: 83 mg/dL (ref 70–99)

## 2011-09-29 LAB — URINALYSIS, ROUTINE W REFLEX MICROSCOPIC
Glucose, UA: NEGATIVE mg/dL
Hgb urine dipstick: NEGATIVE
Ketones, ur: NEGATIVE mg/dL
Protein, ur: NEGATIVE mg/dL

## 2011-09-29 LAB — RAPID URINE DRUG SCREEN, HOSP PERFORMED
Amphetamines: NOT DETECTED
Benzodiazepines: NOT DETECTED
Tetrahydrocannabinol: NOT DETECTED

## 2011-09-29 LAB — PREGNANCY, URINE: Preg Test, Ur: NEGATIVE

## 2011-09-29 NOTE — ED Provider Notes (Signed)
History     CSN: [id number]  Arrival date & time 09/28/11  2325   First MD Initiated Contact with Patient 09/28/11 2333      Chief Complaint  Patient presents with  . Chest Pain  . Dizziness    (Consider location/radiation/quality/duration/timing/severity/associated sxs/prior treatment) HPI  Jasmine Knapp is a 26 y.o. female who presents to the Emergency Department complaining of fatigue, general malaise, nervousness, poor sleep for several months and development of short stabbing chest pain with pressure that began tonight. She is a single mother of three children and is feeling overwhelmed by the responsibility. She denies weight loss, fever, chills, shortness of breath, diarrhea. She has on occasion felt palpitations.She has not taken any medications.  History reviewed. No pertinent past medical history.  Past Surgical History  Procedure Date  . Cesarean section     x3    History reviewed. No pertinent family history.  History  Substance Use Topics  . Smoking status: Current Everyday Smoker  . Smokeless tobacco: Not on file  . Alcohol Use: Yes    OB History    Grav Para Term Preterm Abortions TAB SAB Ect Mult Living                  Review of Systems  Constitutional: Positive for fatigue. Negative for fever, appetite change and unexpected weight change.       10 Systems reviewed and are negative for acute change except as noted in the HPI.  HENT: Negative for congestion.   Eyes: Negative for discharge and redness.  Respiratory: Positive for chest tightness. Negative for cough and shortness of breath.   Cardiovascular: Negative for chest pain.  Gastrointestinal: Negative for vomiting and abdominal pain.  Musculoskeletal: Negative for back pain.  Skin: Negative for rash.  Neurological: Positive for light-headedness. Negative for syncope, numbness and headaches.  Psychiatric/Behavioral:       Insomnia, anxiety.    Allergies  Review of patient's allergies  indicates no known allergies.  Home Medications   Current Outpatient Rx  Name Route Sig Dispense Refill  . HYDROCOD POLST-CPM POLST ER 10-8 MG/5ML PO LQCR Oral Take 5 mLs by mouth every 12 (twelve) hours. 115 mL 0    BP 133/68  Pulse 99  Temp 98.2 F (36.8 C) (Oral)  Resp 18  Ht 5\' 7"  (1.702 m)  Wt 200 lb (90.719 kg)  BMI 31.32 kg/m2  SpO2 99%  LMP 09/14/2011  Physical Exam  Nursing note and vitals reviewed. Constitutional: She is oriented to person, place, and time. She appears well-developed and well-nourished.       Awake, alert, nontoxic appearance.  HENT:  Head: Atraumatic.  Eyes: Conjunctivae and EOM are normal. Pupils are equal, round, and reactive to light. Right eye exhibits no discharge. Left eye exhibits no discharge.  Neck: Neck supple.  Cardiovascular: Normal rate and normal heart sounds.   Pulmonary/Chest: Effort normal and breath sounds normal. She exhibits no tenderness.  Abdominal: Soft. There is no tenderness. There is no rebound.  Musculoskeletal: Normal range of motion. She exhibits no tenderness.       Baseline ROM, no obvious new focal weakness.  Neurological: She is alert and oriented to person, place, and time.       Mental status and motor strength appears baseline for patient and situation.  Skin: No rash noted.  Psychiatric:       Anxious appearing    ED Course  Procedures (including critical care time)  Results for  orders placed during the hospital encounter of 09/28/11  CBC      Component Value Range   WBC 8.9  4.0 - 10.5 K/uL   RBC 4.50  3.87 - 5.11 MIL/uL   Hemoglobin 13.3  12.0 - 15.0 g/dL   HCT 96.0  45.4 - 09.8 %   MCV 85.6  78.0 - 100.0 fL   MCH 29.6  26.0 - 34.0 pg   MCHC 34.5  30.0 - 36.0 g/dL   RDW 11.9  14.7 - 82.9 %   Platelets 308  150 - 400 K/uL  BASIC METABOLIC PANEL      Component Value Range   Sodium 137  135 - 145 mEq/L   Potassium 3.5  3.5 - 5.1 mEq/L   Chloride 102  96 - 112 mEq/L   CO2 25  19 - 32 mEq/L    Glucose, Bld 83  70 - 99 mg/dL   BUN 7  6 - 23 mg/dL   Creatinine, Ser 5.62  0.50 - 1.10 mg/dL   Calcium 9.7  8.4 - 13.0 mg/dL   GFR calc non Af Amer >90  >90 mL/min   GFR calc Af Amer >90  >90 mL/min  URINALYSIS, ROUTINE W REFLEX MICROSCOPIC      Component Value Range   Color, Urine STRAW (*) YELLOW   APPearance CLEAR  CLEAR   Specific Gravity, Urine 1.010  1.005 - 1.030   pH 7.0  5.0 - 8.0   Glucose, UA NEGATIVE  NEGATIVE mg/dL   Hgb urine dipstick NEGATIVE  NEGATIVE   Bilirubin Urine NEGATIVE  NEGATIVE   Ketones, ur NEGATIVE  NEGATIVE mg/dL   Protein, ur NEGATIVE  NEGATIVE mg/dL   Urobilinogen, UA 0.2  0.0 - 1.0 mg/dL   Nitrite NEGATIVE  NEGATIVE   Leukocytes, UA NEGATIVE  NEGATIVE  URINE RAPID DRUG SCREEN (HOSP PERFORMED)      Component Value Range   Opiates NONE DETECTED  NONE DETECTED   Cocaine NONE DETECTED  NONE DETECTED   Benzodiazepines NONE DETECTED  NONE DETECTED   Amphetamines NONE DETECTED  NONE DETECTED   Tetrahydrocannabinol NONE DETECTED  NONE DETECTED   Barbiturates NONE DETECTED  NONE DETECTED  PREGNANCY, URINE      Component Value Range   Preg Test, Ur NEGATIVE  NEGATIVE    Date: 09/28/2011   2341  Rate: 89  Rhythm: normal sinus rhythm  QRS Axis: normal  Intervals: normal  ST/T Wave abnormalities: normal  Conduction Disutrbances: none  Narrative Interpretation: unremarkable      MDM  Patient with generalized complaints coupled with expression of being overwhelmed by single parenthood. Recent move to [place] to be closer to her mother. Labs and EKG unremarkable. Suspect underlying generalized anxiety.  Patient is not suicidal or psychotic. Referral made to [facility].Dx testing d/w pt and mother.  Questions answered.  Verb understanding, agreeable to d/c home with outpt f/u.Pt stable in ED with no significant deterioration in condition.The patient appears reasonably screened and/or stabilized for discharge and I doubt any other medical condition or other [facility]  requiring further screening, evaluation, or treatment in the ED at this time prior to discharge.  MDM Reviewed: nursing note and vitals Interpretation: labs and ECG            Nicoletta Dress. Colon Branch, MD 09/29/11 [phone]

## 2012-03-10 DIAGNOSIS — I499 Cardiac arrhythmia, unspecified: Secondary | ICD-10-CM

## 2013-06-06 ENCOUNTER — Other Ambulatory Visit: Payer: Self-pay | Admitting: Obstetrics and Gynecology

## 2013-09-16 IMAGING — CR DG CHEST 2V
2 series · 2 of 2 positions shown · non-contrast
Comparison: 03/28/2008

CLINICAL DATA: Cough and congestion.

CHEST - 2 VIEW

[w chest pa]
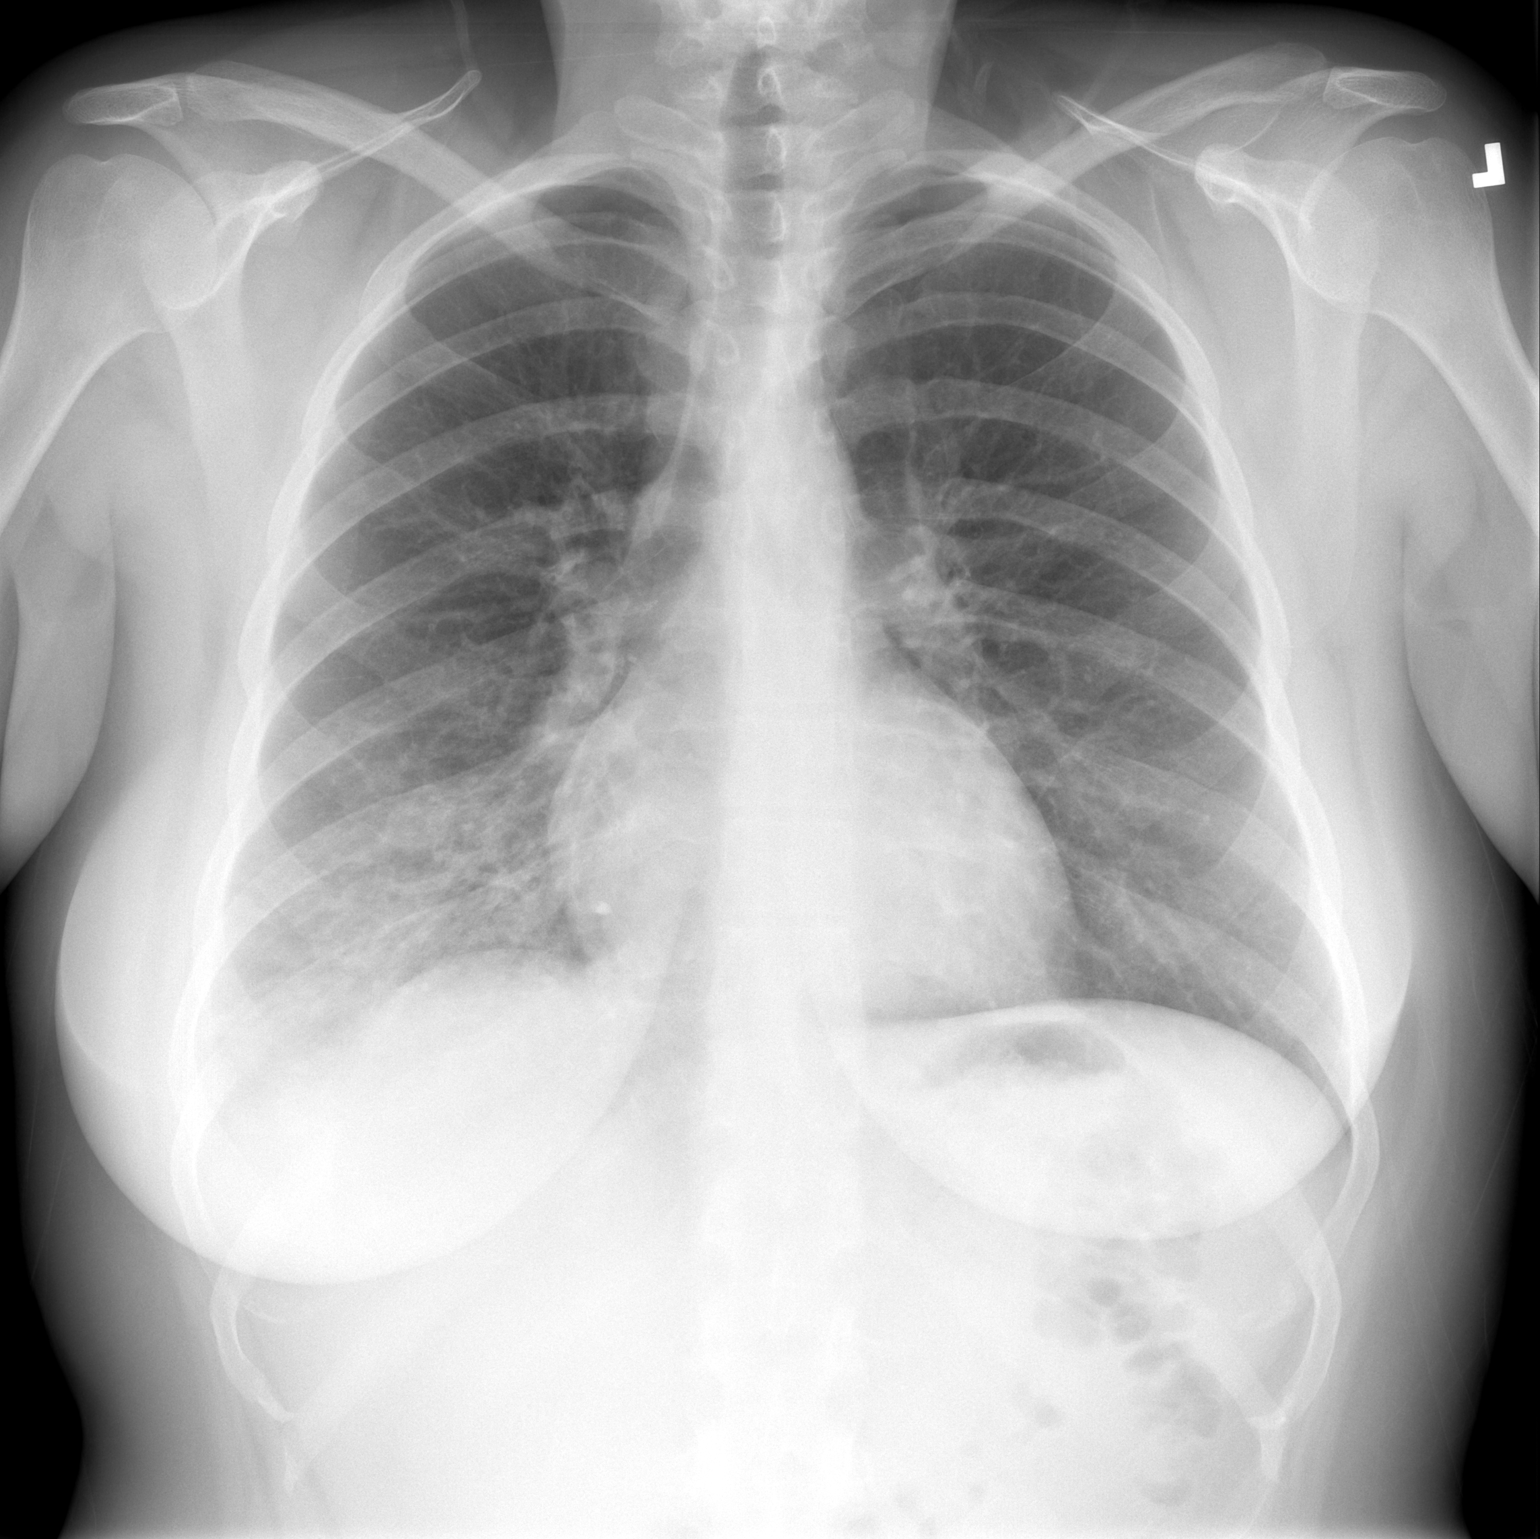

[w chest lat]
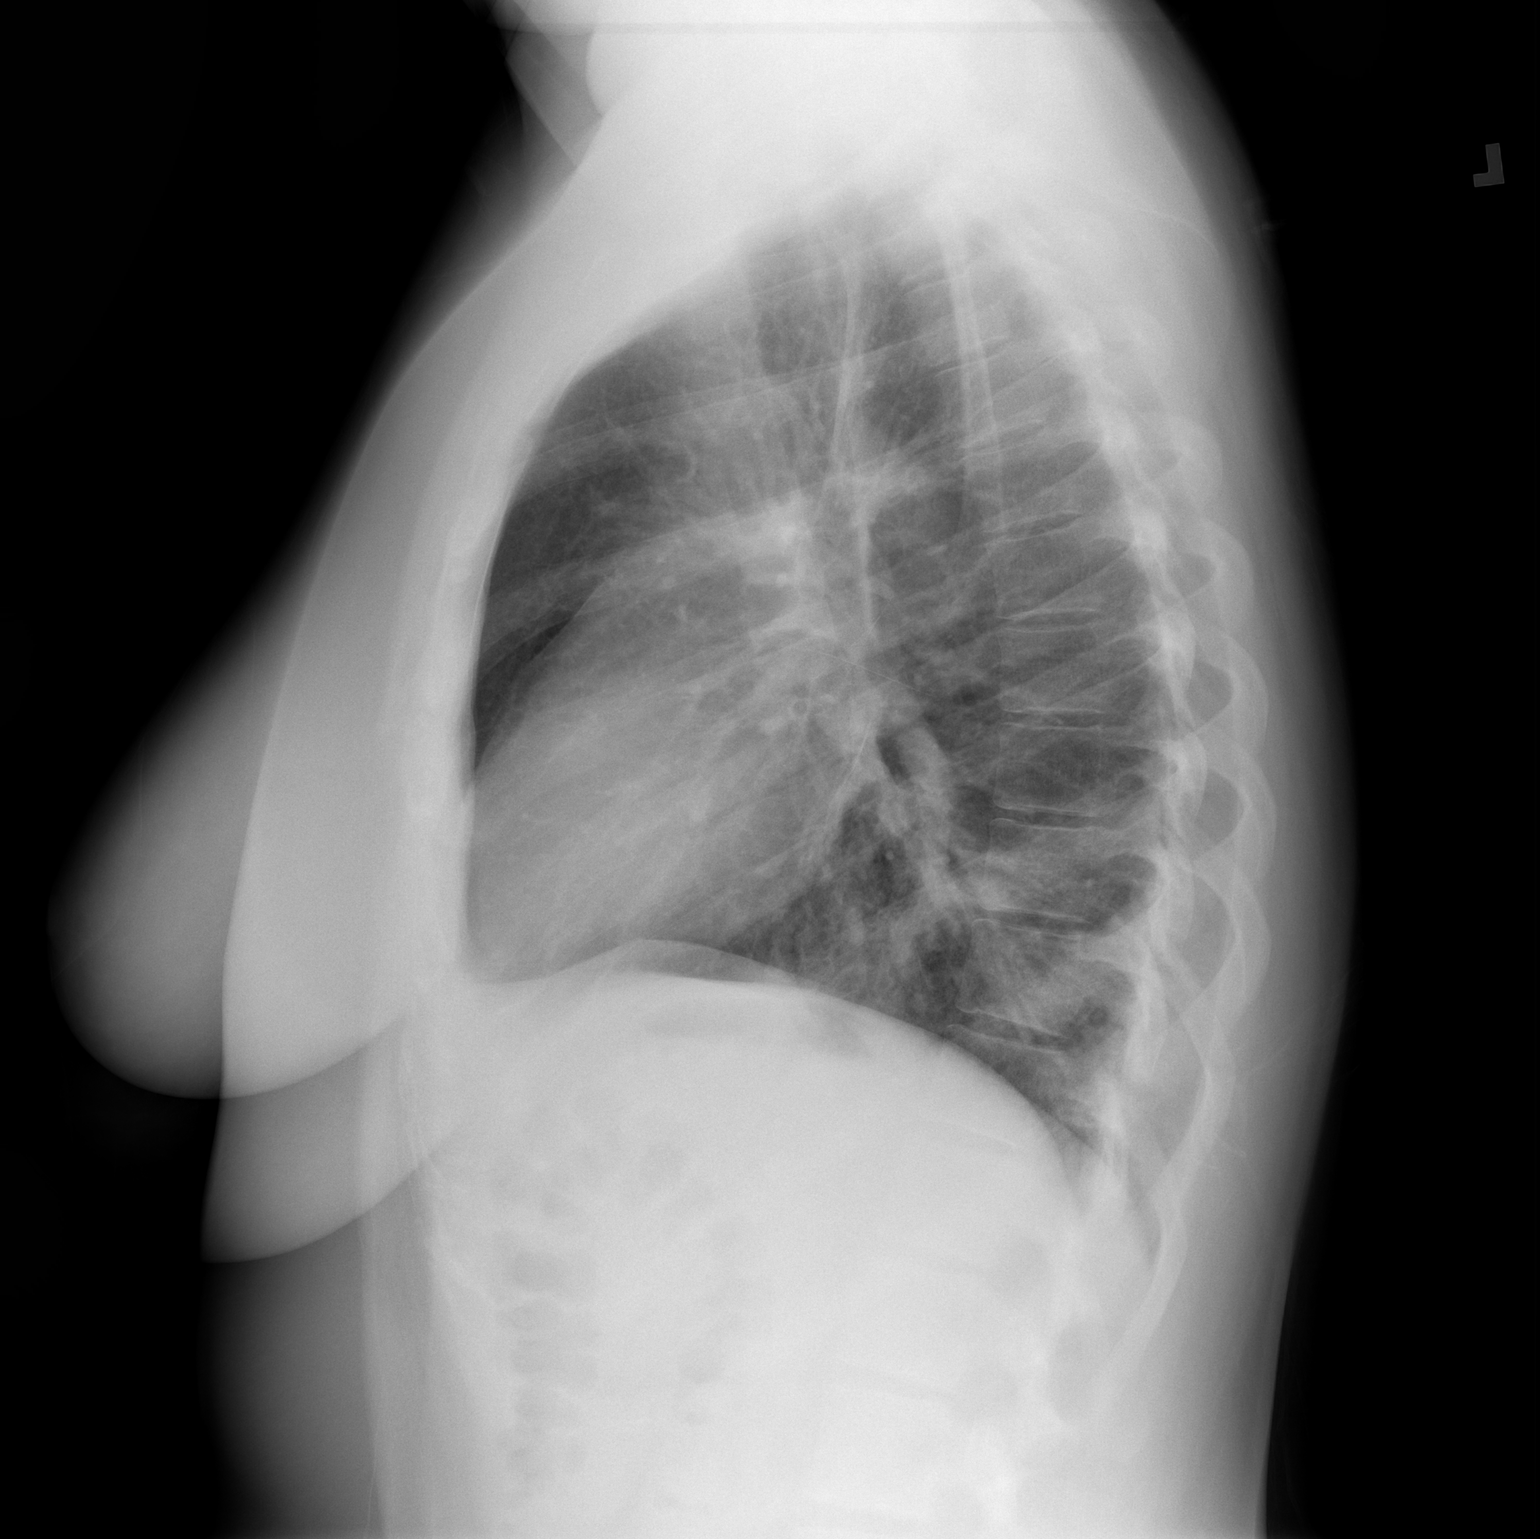

[2 of 2 positions shown; findings below may reference images not displayed]

FINDINGS: There is an infiltrate in the right lower lobe.  No edema
or pleural effusion.  Heart size is normal.  The bony thorax is
unremarkable.
IMPRESSION: Right lower lobe infiltrate.

## 2013-10-28 ENCOUNTER — Encounter: Payer: Self-pay | Admitting: Obstetrics and Gynecology

## 2013-10-28 ENCOUNTER — Ambulatory Visit (INDEPENDENT_AMBULATORY_CARE_PROVIDER_SITE_OTHER): Payer: Medicaid Other | Admitting: Obstetrics and Gynecology

## 2013-10-28 ENCOUNTER — Other Ambulatory Visit (HOSPITAL_COMMUNITY)
Admission: RE | Admit: 2013-10-28 | Discharge: 2013-10-28 | Disposition: A | Discharge status: Home / Self Care | Payer: Medicaid Other | Source: Ambulatory Visit | Attending: Obstetrics and Gynecology | Admitting: Obstetrics and Gynecology

## 2013-10-28 VITALS — BP 118/72 | Ht 67.5 in | Wt 194.0 lb

## 2013-10-28 DIAGNOSIS — Z1151 Encounter for screening for human papillomavirus (HPV): Secondary | ICD-10-CM | POA: Insufficient documentation

## 2013-10-28 DIAGNOSIS — Z01419 Encounter for gynecological examination (general) (routine) without abnormal findings: Secondary | ICD-10-CM | POA: Insufficient documentation

## 2013-10-28 DIAGNOSIS — Z113 Encounter for screening for infections with a predominantly sexual mode of transmission: Secondary | ICD-10-CM | POA: Diagnosis present

## 2013-10-28 DIAGNOSIS — Z0000 Encounter for general adult medical examination without abnormal findings: Secondary | ICD-10-CM
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
## 2013-10-28 NOTE — Addendum Note (Signed)
Addended by: Gaylyn Rong A on: 10/28/2013 12:48 PM   Modules accepted: Orders

## 2013-10-29 LAB — CYTOLOGY - PAP

## 2013-10-29 LAB — HIV ANTIBODY (ROUTINE TESTING W REFLEX): HIV: NONREACTIVE

## 2013-10-29 LAB — RPR

## 2013-12-06 ENCOUNTER — Ambulatory Visit (HOSPITAL_COMMUNITY): Payer: Self-pay | Admitting: Psychiatry

## 2016-08-20 ENCOUNTER — Ambulatory Visit (HOSPITAL_COMMUNITY)
Admission: AD | Admit: 2016-08-20 | Discharge: 2016-08-20 | Disposition: A | Discharge status: Home / Self Care | Payer: Self-pay | Attending: Psychiatry | Admitting: Psychiatry

## 2016-08-20 ENCOUNTER — Encounter (HOSPITAL_COMMUNITY): Payer: Self-pay

## 2016-08-20 DIAGNOSIS — Z1389 Encounter for screening for other disorder: Secondary | ICD-10-CM | POA: Insufficient documentation
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
## 2016-08-20 NOTE — H&P (Signed)
Behavioral Health Medical Screening Exam  Jasmine Knapp is an 31 y.o. female.  Total Time spent with patient: 15 minutes  Psychiatric Specialty Exam: Physical Exam  Constitutional: She is oriented to person, place, and time. She appears well-developed and well-nourished. No distress.  HENT:  Head: Normocephalic and atraumatic.  Right Ear: External ear normal.  Left Ear: External ear normal.  Eyes: Conjunctivae are normal. Pupils are equal, round, and reactive to light. Right eye exhibits no discharge. Left eye exhibits no discharge. No scleral icterus.  Neck: Normal range of motion.  Cardiovascular: Normal rate and regular rhythm.   Respiratory: Effort normal and breath sounds normal. No respiratory distress.  Musculoskeletal: Normal range of motion.  Neurological: She is alert and oriented to person, place, and time.  Skin: Skin is warm and dry. She is not diaphoretic.  Psychiatric: Her speech is normal and behavior is normal. Her mood appears anxious. Thought content is not paranoid and not delusional. Cognition and memory are normal. She expresses impulsivity and inappropriate judgment. She exhibits a depressed mood. She expresses no homicidal and no suicidal ideation.    Review of Systems  Psychiatric/Behavioral: Positive for depression and substance abuse. Negative for hallucinations, memory loss and suicidal ideas. The patient is nervous/anxious and has insomnia.   All other systems reviewed and are negative.   Blood pressure 125/81, pulse 89, temperature 98.7 F (37.1 C), temperature source Oral, resp. rate 18, SpO2 100 %.There is no height or weight on file to calculate BMI.  General Appearance: Casual and Well Groomed  Eye Contact:  Good  Speech:  Clear and Coherent and Normal Rate  Volume:  Normal  Mood:  Anxious, Depressed, Hopeless and Worthless  Affect:  Appropriate and Full Range  Thought Process:  Coherent and Goal Directed  Orientation:  Full (Time, Place, and  Person)  Thought Content:  Logical and Hallucinations: None  Suicidal Thoughts:  No  Homicidal Thoughts:  No  Memory:  Immediate;   Good Recent;   Good Remote;   Good  Judgement:  Fair  Insight:  Fair  Psychomotor Activity:  Normal  Concentration: Concentration: Good and Attention Span: Good  Recall:  Good  Fund of Knowledge:Good  Language: Good  Akathisia:  No  Handed:  Right  AIMS (if indicated):     Assets:  Communication Skills Desire for Improvement Financial Resources/Insurance Housing Leisure Time Physical Health Social Support Transportation  Sleep:       Musculoskeletal: Strength & Muscle Tone: within normal limits Gait & Station: normal   Blood pressure 125/81, pulse 89, temperature 98.7 F (37.1 C), temperature source Oral, resp. rate 18, SpO2 100 %.  Recommendations:  Based on my evaluation the patient does not appear to have an emergency medical condition.  Jackelyn PolingJason A Rosa Wyly, NP 08/20/2016, 11:12 PM

## 2018-07-18 ENCOUNTER — Telehealth: Payer: Self-pay | Admitting: Family

## 2018-07-18 DIAGNOSIS — J069 Acute upper respiratory infection, unspecified: Secondary | ICD-10-CM

## 2018-07-18 MED ORDER — FLUTICASONE PROPIONATE 50 MCG/ACT NA SUSP: 2.0000 | Freq: Every day | NASAL | 6 refills | Status: DC

## 2018-07-18 MED ORDER — BENZONATATE 100 MG PO CAPS: 100.0000 mg | ORAL_CAPSULE | Freq: Three times a day (TID) | ORAL | 0 refills | Status: DC | PRN

## 2018-07-18 MED ORDER — AZITHROMYCIN 250 MG PO TABS: ORAL_TABLET | ORAL | 0 refills | Status: DC

## 2018-07-18 NOTE — Addendum Note (Signed)
Addended by: Jannifer Rodney A on: 07/18/2018 07:55 PM   Modules accepted: Orders

## 2018-07-18 NOTE — Progress Notes (Signed)
We are sorry you are not feeling well.  Here is how we plan to help!  Approximately 5 minutes was spent documenting and reviewing patient's chart.    Based on what you have shared with me, it looks like you may have a viral upper respiratory infection.  Upper respiratory infections are caused by a large number of viruses; however, rhinovirus is the most common cause.   Symptoms vary from person to person, with common symptoms including sore throat, cough, and fatigue or lack of energy.  A low-grade fever of up to 100.4 may present, but is often uncommon.  Symptoms vary however, and are closely related to a person's age or underlying illnesses.  The most common symptoms associated with an upper respiratory infection are nasal discharge or congestion, cough, sneezing, headache and pressure in the ears and face.  These symptoms usually persist for about 3 to 10 days, but can last up to 2 weeks.  It is important to know that upper respiratory infections do not cause serious illness or complications in most cases.    Upper respiratory infections can be transmitted from person to person, with the most common method of transmission being a person's hands.  The virus is able to live on the skin and can infect other persons for up to 2 hours after direct contact.  Also, these can be transmitted when someone coughs or sneezes; thus, it is important to cover the mouth to reduce this risk.  To keep the spread of the illness at bay, good hand hygiene is very important.  This is an infection that is most likely caused by a virus. There are no specific treatments other than to help you with the symptoms until the infection runs its course.  We are sorry you are not feeling well.  Here is how we plan to help!   For nasal congestion, you may use an oral decongestants such as Mucinex D or if you have glaucoma or high blood pressure use plain Mucinex.  Saline nasal spray or nasal drops can help and can safely be used as  often as needed for congestion.  For your congestion, I have prescribed Fluticasone nasal spray one spray in each nostril twice a day  If you do not have a history of heart disease, hypertension, diabetes or thyroid disease, prostate/bladder issues or glaucoma, you may also use Sudafed to treat nasal congestion.  It is highly recommended that you consult with a pharmacist or your primary care physician to ensure this medication is safe for you to take.     If you have a cough, you may use cough suppressants such as Delsym and Robitussin.  If you have glaucoma or high blood pressure, you can also use Coricidin HBP.   For cough I have prescribed for you A prescription cough medication called Tessalon Perles 100 mg. You may take 1-2 capsules every 8 hours as needed for cough.  If you have a sore or scratchy throat, use a saltwater gargle-  to  teaspoon of salt dissolved in a 4-ounce to 8-ounce glass of warm water.  Gargle the solution for approximately 15-30 seconds and then spit.  It is important not to swallow the solution.  You can also use throat lozenges/cough drops and Chloraseptic spray to help with throat pain or discomfort.  Warm or cold liquids can also be helpful in relieving throat pain.  For headache, pain or general discomfort, you can use Ibuprofen or Tylenol as directed.   Some  authorities believe that zinc sprays or the use of Echinacea may shorten the course of your symptoms.   HOME CARE . Only take medications as instructed by your medical team. . Be sure to drink plenty of fluids. Water is fine as well as fruit juices, sodas and electrolyte beverages. You may want to stay away from caffeine or alcohol. If you are nauseated, try taking small sips of liquids. How do you know if you are getting enough fluid? Your urine should be a pale yellow or almost colorless. . Get rest. . Taking a steamy shower or using a humidifier may help nasal congestion and ease sore throat pain. You can  place a towel over your head and breathe in the steam from hot water coming from a faucet. . Using a saline nasal spray works much the same way. . Cough drops, hard candies and sore throat lozenges may ease your cough. . Avoid close contacts especially the very young and the elderly . Cover your mouth if you cough or sneeze . Always remember to wash your hands.   GET HELP RIGHT AWAY IF: . You develop worsening fever. . If your symptoms do not improve within 10 days . You develop yellow or green discharge from your nose over 3 days. . You have coughing fits . You develop a severe head ache or visual changes. . You develop shortness of breath, difficulty breathing or start having chest pain . Your symptoms persist after you have completed your treatment plan  MAKE SURE YOU   Understand these instructions.  Will watch your condition.  Will get help right away if you are not doing well or get worse.  Your e-visit answers were reviewed by a board certified advanced clinical practitioner to complete your personal care plan. Depending upon the condition, your plan could have included both over the counter or prescription medications. Please review your pharmacy choice. If there is a problem, you may call our nursing hot line at and have the prescription routed to another pharmacy. Your safety is important to us. If you have drug allergies check your prescription carefully.   You can use MyChart to ask questions about today's visit, request a non-urgent call back, or ask for a work or school excuse for 24 hours related to this e-Visit. If it has been greater than 24 hours you will need to follow up with your provider, or enter a new e-Visit to address those concerns. You will get an e-mail in the next two days asking about your experience.  I hope that your e-visit has been valuable and will speed your recovery. Thank you for using e-visits.      

## 2019-04-15 ENCOUNTER — Other Ambulatory Visit: Payer: Self-pay

## 2019-04-15 ENCOUNTER — Encounter: Payer: Self-pay | Admitting: Adult Health

## 2019-04-15 ENCOUNTER — Ambulatory Visit (INDEPENDENT_AMBULATORY_CARE_PROVIDER_SITE_OTHER): Payer: Medicaid Other | Admitting: Adult Health

## 2019-04-15 VITALS — BP 128/93 | HR 98 | Ht 67.0 in | Wt 226.5 lb

## 2019-04-15 DIAGNOSIS — R102 Pelvic and perineal pain: Secondary | ICD-10-CM | POA: Diagnosis not present

## 2019-04-15 DIAGNOSIS — N898 Other specified noninflammatory disorders of vagina: Secondary | ICD-10-CM | POA: Diagnosis not present

## 2019-04-15 DIAGNOSIS — R319 Hematuria, unspecified: Secondary | ICD-10-CM | POA: Diagnosis not present

## 2019-04-15 DIAGNOSIS — Z113 Encounter for screening for infections with a predominantly sexual mode of transmission: Secondary | ICD-10-CM | POA: Diagnosis not present

## 2019-04-15 DIAGNOSIS — Z803 Family history of malignant neoplasm of breast: Secondary | ICD-10-CM | POA: Insufficient documentation

## 2019-04-15 LAB — POCT URINALYSIS DIPSTICK
Glucose, UA: NEGATIVE
Ketones, UA: NEGATIVE
Leukocytes, UA: NEGATIVE
Nitrite, UA: NEGATIVE
Protein, UA: NEGATIVE

## 2019-04-15 NOTE — Progress Notes (Signed)
Patient ID: Jasmine Knapp, female   DOB: [birth year], 34 y.o.   MRN: [id number] History of Present Illness: Jasmine Knapp is a 34 year old white female,single, G3P3 in complaining of pelvic pain and low back pain. PCP is Dr Sherrie Sport.    Current Medications, Allergies, Past Medical History, Past Surgical History, Family History and Social History were reviewed in [employer] record.     Review of Systems: +pain in low pelvic area and low back, x 1 month,heating pad helps, worse with sitting Was treated for UTI with cipro Denies pain with sex Periods good Hx anxiety    Physical Exam:BP (!) 128/93 (BP Location: Left Arm, Patient Position: Sitting, Cuff Size: Large)   Pulse 98   Ht '5\' 7"'$  (1.702 m)   Wt 226 lb 8 oz (102.7 kg)   LMP 04/01/2019 (Approximate)   BMI 35.47 kg/m  urine dipstick trace blood. General:  Well developed, well nourished, no acute distress Skin:  Warm and dry Neck:  Midline trachea, normal thyroid, good ROM, no lymphadenopathy Lungs; Clear to auscultation bilaterally Cardiovascular: Regular rate and rhythm Abdomen:  Soft, non tender, no hepatosplenomegaly Pelvic:  External genitalia is normal in appearance, no lesions.  The vagina is normal in appearance,mucpus discharge with no odor. Urethra has no lesions or masses. The cervix is smooth, no CMT.  Uterus is felt to be normal size, shape, and contour.  No adnexal masses or tenderness noted.Bladder is non tender, no masses felt. Extremities/musculoskeletal:  No swelling or varicosities noted, no clubbing or cyanosis,no straight leg raises or pint tenderness on back, No CVAT Psych:  No mood changes, alert and cooperative,seems happy Fall risk is low PHQ 9 score is 21, denies being suicidal. Had prescription for Wellbutrin  from PCP but did not start  Examination chaperoned by Levy Pupa LPN.  Impression and Plan:  1. Hematuria, unspecified type   2. Pelvic pain  GYN [place] in 1 week, then see me  about 4 days later for pap and physical  Take tylenol or advil and rest  Can alternate heat and ice   3. Vaginal discharge Nuswab sent  4. Screening examination for STD (sexually transmitted disease) Nuswab sent   5. Family history of breast cancer in sister at age 23 and, BRCA  2+ Will get mammogram ordered at physical

## 2019-04-17 LAB — NUSWAB VAGINITIS PLUS (VG+)
Candida albicans, NAA: NEGATIVE
Candida glabrata, NAA: NEGATIVE
Chlamydia trachomatis, NAA: NEGATIVE
Neisseria gonorrhoeae, NAA: NEGATIVE
Trich vag by NAA: NEGATIVE

## 2019-04-22 ENCOUNTER — Ambulatory Visit (INDEPENDENT_AMBULATORY_CARE_PROVIDER_SITE_OTHER): Payer: Medicaid Other

## 2019-04-22 ENCOUNTER — Other Ambulatory Visit: Payer: Self-pay

## 2019-04-22 ENCOUNTER — Other Ambulatory Visit: Payer: Medicaid Other

## 2019-04-22 DIAGNOSIS — R102 Pelvic and perineal pain: Secondary | ICD-10-CM

## 2019-04-22 NOTE — Progress Notes (Signed)
PELVIC US TA/TV:anteverted/ retroflexed uterus,wnl (limited view),EEC 6.2 mm,normal right ovary, 1.9 x 1.4 x 1.7 cm collapsing left hemorrhagic ovarian cyst,unable to slide left ovary because of left adnexa pain,right ovary appears mobile,no free fluid

## 2019-04-26 ENCOUNTER — Telehealth: Payer: Self-pay | Admitting: *Deleted

## 2019-04-26 ENCOUNTER — Other Ambulatory Visit: Payer: Medicaid Other | Admitting: Adult Health

## 2019-04-26 NOTE — Telephone Encounter (Signed)
Pt aware that US showed normal uterus and right ovary, has collapsing cyst left ovary and she is feeling better

## 2019-04-26 NOTE — Telephone Encounter (Signed)
Pt was unable to keep her ultrasound results and pap appt today. She would like to receive a call with results. Per chart review results not read.

## 2019-05-02 ENCOUNTER — Other Ambulatory Visit: Payer: Medicaid Other | Admitting: Obstetrics and Gynecology

## 2019-05-29 ENCOUNTER — Other Ambulatory Visit: Payer: Medicaid Other | Admitting: Obstetrics and Gynecology

## 2023-03-28 ENCOUNTER — Encounter: Payer: Self-pay | Admitting: Adult Health

## 2023-03-28 ENCOUNTER — Ambulatory Visit (INDEPENDENT_AMBULATORY_CARE_PROVIDER_SITE_OTHER): Payer: Medicaid Other | Admitting: Adult Health

## 2023-03-28 ENCOUNTER — Ambulatory Visit (HOSPITAL_COMMUNITY)
Admission: RE | Admit: 2023-03-28 | Discharge: 2023-03-28 | Disposition: A | Discharge status: Home / Self Care | Payer: Medicaid Other | Source: Ambulatory Visit | Attending: Adult Health | Admitting: Adult Health

## 2023-03-28 ENCOUNTER — Institutional Professional Consult (permissible substitution): Payer: Medicaid Other | Admitting: Adult Health

## 2023-03-28 VITALS — BP 118/80 | HR 108 | Ht 67.0 in | Wt 255.0 lb

## 2023-03-28 DIAGNOSIS — R0602 Shortness of breath: Secondary | ICD-10-CM | POA: Diagnosis present

## 2023-03-28 DIAGNOSIS — G4709 Other insomnia: Secondary | ICD-10-CM

## 2023-03-28 DIAGNOSIS — F419 Anxiety disorder, unspecified: Secondary | ICD-10-CM

## 2023-03-28 DIAGNOSIS — G4719 Other hypersomnia: Secondary | ICD-10-CM

## 2023-03-28 DIAGNOSIS — R002 Palpitations: Secondary | ICD-10-CM

## 2023-03-28 NOTE — Progress Notes (Unsigned)
@Patient  ID: Jasmine Knapp, female    DOB: [birth year], 38 y.o.   MRN: [id number]  Chief Complaint  Patient presents with   Consult  Discussed the use of AI scribe software for clinical note transcription with the patient, who gave verbal consent to proceed.  Referring provider: [facility],*  HPI: 38 year old female seen for sleep consult March 28, 2023 for snoring and daytime sleepiness  TEST/EVENTS :   03/28/23 Sleep consult  Patient presents for a sleep consult today.  Patient was kindly referred by cardiology, Rennis Harding, FNP .  Patient says she is followed by cardiology for arrhythmias.  She had complained of loud snoring, daytime sleepiness, sleep disturbances and was recommended to come to our office for evaluation of possible underlying sleep apnea. She reports  difficulty falling asleep and maintaining sleep, leading to extreme daytime tiredness that has impacted her ability to work. The patient describes the fatigue as 'beyond able to function tired,' and it often necessitates napping after performing basic household tasks. The patient has experienced a significant weight gain, from 163 pounds in 2017 to 255 pounds currently. They deny being on any medications. The patient also reports a history of anxiety and panic attacks, which began around 2012-2013. They have not sought medical treatment for these symptoms due to a fear of medication. Patient typically goes to bed about midnight to 2 AM.  Can take up to 3 hours to go to sleep.  Is up several times throughout the night.  Gets up at 10 AM.  Has never had a sleep study before.  Feels that she has brain fog and poor concentration.  She says she wakes up frequently.  Always wakes up tired.  She says that this is impacted her life at every aspect now is unable to work due to her's daytime sleepiness.  She says she does try to work part-time with [employer] ([employer]) but has to get someone to help her drive because of her  sleepiness.  Denies any history of congestive heart failure or stroke.  Does not use any caffeine.  She says she has an occasional headache.  Does not use any sleep aids.  She does typically take a nap for once a day.  Usually about 15 to 20 minutes only.  Epworth score is 10 out of 24.  Typically gets sleepy if she sits down to watch TV, the afternoon hours and after eating lunch. The patient has a history of smoking, having reduced from two packs a day to half a pack a day in the past spring. They also report a history of alcoholism. The patient is currently not drinking.-Stop drinking in 2021.  We discussed smoking cessation.  The patient has been experiencing an remittent shortness of breath, has noticed it more over the last 2 to 3 months.  Says at times she gets short of breath with heavy activities.  Also has an intermittent cough.  Denies any fever, discolored mucus.  As above she has a heavy smoking history.  She is not on any inhalers.  Does have a family history of COPD.       The patient, with a history of arrhythmias, presents with significant sleep disturbances and debilitating fatigue. They report difficulty falling asleep and maintaining sleep, leading to extreme daytime tiredness that has impacted their ability to work. The patient describes the fatigue as 'beyond able to function tired,' and it often necessitates napping after performing basic household tasks.  The patient has experienced  a significant weight gain, from 163 pounds in 2017 to 255 pounds currently. They deny being on any medications. The patient also reports a history of anxiety and panic attacks, which began around 2012-2013. They have not sought medical treatment for these symptoms due to a fear of medication.  The patient has a history of smoking, having reduced from two packs a day to half a pack a day in the past spring. They also report a history of alcoholism, which was under control until 2021. The patient is  currently not drinking.  The patient has been experiencing shortness of breath, which has worsened since Thanksgiving. They describe episodes of being unable to catch their breath, with oxygen levels dropping to 88 for about 15 minutes during these episodes. The patient also reports a cough that accompanies these episodes of shortness of breath.  The patient has a history of sleep apnea, for which they were previously on CPAP therapy. However, they have not been on CPAP for over a year due to claustrophobia. The patient reports symptoms of snoring and daytime sleepiness, suggesting the possibility of ongoing sleep apnea.       Allergies  Allergen Reactions   Penicillins Hives   Doxycycline Other (See Comments)    Headaches  Headache     There is no immunization history on file for this patient.  Past Medical History:  Diagnosis Date   Anxiety    Panic disorder     Tobacco History: Social History   Tobacco Use  Smoking Status Every Day   Current packs/day: 0.50   Types: Cigarettes  Smokeless Tobacco Never   Ready to quit: Not Answered Counseling given: Not Answered   No outpatient medications prior to visit.   No facility-administered medications prior to visit.     Review of Systems:   Constitutional:   No  weight loss, night sweats,  Fevers, chills, +fatigue, or  lassitude.  HEENT:   No headaches,  Difficulty swallowing,  Tooth/dental problems, or  Sore throat,                No sneezing, itching, ear ache, nasal congestion, post nasal drip,   CV:  No chest pain,  Orthopnea, PND, swelling in lower extremities, anasarca, dizziness, palpitations, syncope.   GI  No heartburn, indigestion, abdominal pain, nausea, vomiting, diarrhea, change in bowel habits, loss of appetite, bloody stools.   Resp:.  No chest wall deformity  Skin: no rash or lesions.  GU: no dysuria, change in color of urine, no urgency or frequency.  No flank pain, no hematuria   MS:  No  joint pain or swelling.  No decreased range of motion.  No back pain.    Physical Exam  BP 118/80   Pulse (!) 108   Ht 5\' 7"  (1.702 m)   Wt 255 lb (115.7 kg)   SpO2 97% Comment: room air  BMI 39.94 kg/m   GEN: A/Ox3; pleasant , NAD, well nourished    HEENT:  [place]/AT,  , NOSE-clear, THROAT-clear, no lesions, no postnasal drip or exudate noted.  Class III MP airway.  Tonsils 1-2+.  NECK:  Supple w/ fair ROM; no JVD; normal carotid impulses w/o bruits; no thyromegaly or nodules palpated; no lymphadenopathy.    RESP  Clear  P & A; w/o, wheezes/ rales/ or rhonchi. no accessory muscle use, no dullness to percussion  CARD:  RRR, no m/r/g, no peripheral edema, pulses intact, no cyanosis or clubbing.  GI:   Soft &  nt; nml bowel sounds; no organomegaly or masses detected.   Musco: Warm bil, no deformities or joint swelling noted.   Neuro: alert, no focal deficits noted.    Skin: Warm, no lesions or rashes    Lab Results:  CBC   BNP No results found for: "BNP"  ProBNP No results found for: "PROBNP"  Imaging:  Administration History     None           No data to display          No results found for: "NITRICOXIDE"      Assessment & Plan:   Excessive daytime sleepiness Excessive daytime sleepiness, restless sleep, chronic insomnia, loud snoring, morbid obesity with BMI at 39-highly suspicious for underlying sleep apnea.  Will set patient up for home sleep study.  Patient education given on sleep apnea - discussed how weight can impact sleep and risk for sleep disordered breathing - discussed options to assist with weight loss: combination of diet modification, cardiovascular and strength training exercises   - had an extensive discussion regarding the adverse health consequences related to untreated sleep disordered breathing - specifically discussed the risks for hypertension, coronary artery disease, cardiac dysrhythmias, cerebrovascular disease, and  diabetes - lifestyle modification discussed   - discussed how sleep disruption can increase risk of accidents, particularly when driving - safe driving practices were discussed   Plan  Patient Instructions  Set up for home sleep study  Work on healthy weight loss  Do not drive if sleepy   Set up for PFT  Chest xray today  Work on not smoking   Follow up in 6 weeks to discuss results and treatment plan     Dyspnea Intermittent shortness of breath and cough in a patient that has heavy smoking history.  Will begin workup with chest x-ray.  Set up for PFTs on return.  Smoking cessation.  Hold on medications at this time.  May use over-the-counter cough medicines as needed  PLAN  Patient Instructions  Set up for home sleep study  Work on healthy weight loss  Do not drive if sleepy   Set up for PFT  Chest xray today  Work on not smoking   Follow up in 6 weeks to discuss results and treatment plan     Morbid obesity (HCC) Healthy weight loss discussed.  Insomnia Chronic insomnia.  Patient also has underlying anxiety and depression.  Have encouraged her on a healthy sleep regimen.  Hold on any sleep aids at this time.  Sleep study is pending  Anxiety Anxiety-stress reducers encouraged.  Follow-up with primary care for ongoing management and treatment options  Palpitations History of palpitations.  Continue follow-up with cardiology.  Home sleep study to rule out underlying sleep apnea and nocturnal hypoxemia    Rubye Oaks, NP

## 2023-03-28 NOTE — Patient Instructions (Signed)
Set up for home sleep study  Work on healthy weight loss  Do not drive if sleepy   Set up for PFT  Chest xray today  Work on not smoking   Follow up in 6 weeks to discuss results and treatment plan

## 2023-03-29 ENCOUNTER — Telehealth: Payer: Self-pay | Admitting: Adult Health

## 2023-03-29 DIAGNOSIS — G4700 Insomnia, unspecified: Secondary | ICD-10-CM | POA: Insufficient documentation

## 2023-03-29 DIAGNOSIS — G4719 Other hypersomnia: Secondary | ICD-10-CM | POA: Insufficient documentation

## 2023-03-29 DIAGNOSIS — R0600 Dyspnea, unspecified: Secondary | ICD-10-CM | POA: Insufficient documentation

## 2023-03-29 DIAGNOSIS — F419 Anxiety disorder, unspecified: Secondary | ICD-10-CM | POA: Insufficient documentation

## 2023-03-29 DIAGNOSIS — R002 Palpitations: Secondary | ICD-10-CM | POA: Insufficient documentation

## 2023-03-29 NOTE — Assessment & Plan Note (Signed)
History of palpitations.  Continue follow-up with cardiology.  Home sleep study to rule out underlying sleep apnea and nocturnal hypoxemia

## 2023-03-29 NOTE — Telephone Encounter (Signed)
Left message for patient to call and discuss scheduling the PFT and 6+ week follow up appointment per 03/28/23 AVS (Tammy Parrett, NP)

## 2023-03-29 NOTE — Assessment & Plan Note (Signed)
Chronic insomnia.  Patient also has underlying anxiety and depression.  Have encouraged her on a healthy sleep regimen.  Hold on any sleep aids at this time.  Sleep study is pending

## 2023-03-29 NOTE — Assessment & Plan Note (Signed)
Anxiety-stress reducers encouraged.  Follow-up with primary care for ongoing management and treatment options

## 2023-03-29 NOTE — Assessment & Plan Note (Signed)
Healthy weight loss discussed 

## 2023-03-29 NOTE — Assessment & Plan Note (Signed)
Intermittent shortness of breath and cough in a patient that has heavy smoking history.  Will begin workup with chest x-ray.  Set up for PFTs on return.  Smoking cessation.  Hold on medications at this time.  May use over-the-counter cough medicines as needed  PLAN  Patient Instructions  Set up for home sleep study  Work on healthy weight loss  Do not drive if sleepy   Set up for PFT  Chest xray today  Work on not smoking   Follow up in 6 weeks to discuss results and treatment plan

## 2023-03-29 NOTE — Assessment & Plan Note (Signed)
Excessive daytime sleepiness, restless sleep, chronic insomnia, loud snoring, morbid obesity with BMI at 39-highly suspicious for underlying sleep apnea.  Will set patient up for home sleep study.  Patient education given on sleep apnea - discussed how weight can impact sleep and risk for sleep disordered breathing - discussed options to assist with weight loss: combination of diet modification, cardiovascular and strength training exercises   - had an extensive discussion regarding the adverse health consequences related to untreated sleep disordered breathing - specifically discussed the risks for hypertension, coronary artery disease, cardiac dysrhythmias, cerebrovascular disease, and diabetes - lifestyle modification discussed   - discussed how sleep disruption can increase risk of accidents, particularly when driving - safe driving practices were discussed   Plan  Patient Instructions  Set up for home sleep study  Work on healthy weight loss  Do not drive if sleepy   Set up for PFT  Chest xray today  Work on not smoking   Follow up in 6 weeks to discuss results and treatment plan

## 2023-03-30 NOTE — Telephone Encounter (Signed)
LVM for patient to call and discuss scheduling the PFT and 6 week follow up as requested by Rubye Oaks, NP

## 2023-04-04 NOTE — Telephone Encounter (Signed)
LVM for patient to call and discuss scheduling the PFT  and 6 week follow up ordered by Rubye Oaks, NP

## 2023-05-09 ENCOUNTER — Telehealth: Payer: Self-pay | Admitting: Pulmonary Disease

## 2023-05-09 NOTE — Telephone Encounter (Signed)
 Missed HST pick up box appt. Can she PU box in [place]. Please call to advise. TY.

## 2023-05-19 NOTE — Telephone Encounter (Signed)
 Send letter via Earleen Reaper for pt to call me back to schedule,  Per protocol, closing message

## 2023-05-23 ENCOUNTER — Ambulatory Visit (HOSPITAL_COMMUNITY)
Admission: RE | Admit: 2023-05-23 | Discharge: 2023-05-23 | Disposition: A | Discharge status: Home / Self Care | Payer: Medicaid Other | Source: Ambulatory Visit | Attending: Adult Health | Admitting: Adult Health

## 2023-05-23 DIAGNOSIS — R0602 Shortness of breath: Secondary | ICD-10-CM | POA: Insufficient documentation

## 2023-05-23 LAB — PULMONARY FUNCTION TEST
DL/VA % pred: 112 %
DL/VA: 4.88 ml/min/mmHg/L
DLCO unc % pred: 105 %
DLCO unc: 25.6 ml/min/mmHg
FEF 25-75 Pre: 2.58 L/s
FEF2575-%Pred-Pre: 77 %
FEV1-%Pred-Pre: 89 %
FEV1-Pre: 2.98 L
FEV1FVC-%Pred-Pre: 90 %
FEV6-%Pred-Pre: 98 %
FEV6-Pre: 3.93 L
FEV6FVC-%Pred-Pre: 100 %
FVC-%Pred-Pre: 97 %
FVC-Pre: 3.98 L
Pre FEV1/FVC ratio: 75 %
Pre FEV6/FVC Ratio: 99 %
RV % pred: 99 %
RV: 1.7 L
TLC % pred: 100 %
TLC: 5.54 L

## 2023-05-26 ENCOUNTER — Telehealth: Payer: Self-pay | Admitting: Adult Health

## 2023-05-26 NOTE — Telephone Encounter (Signed)
 Jasmine Knapp- the pt has had PFT but not set up for HST yet  She has appt for this next wk  She is asking for PFT results Please advise, thanks!

## 2023-05-26 NOTE — Telephone Encounter (Signed)
 Pt hs requested PFT results.

## 2023-05-26 NOTE — Telephone Encounter (Signed)
 Please see last closed encounter. PT trying to get back with [facility] on sched a box pick up in [place]. Her # is [phone]

## 2023-05-28 NOTE — Telephone Encounter (Signed)
 See result note- show minimal airflow obstruction . Continue to work on stopping smoking  Will discuss in detail at follow up

## 2023-05-29 ENCOUNTER — Encounter

## 2023-05-29 NOTE — Telephone Encounter (Signed)
 Pt is aware of results and voiced her understanding.  Appt scheduled 08/15/2023. Nothing further needed.

## 2023-08-15 ENCOUNTER — Other Ambulatory Visit: Payer: Self-pay | Admitting: Adult Health

## 2023-08-15 ENCOUNTER — Ambulatory Visit: Admitting: Adult Health

## 2023-08-15 ENCOUNTER — Encounter: Payer: Self-pay | Admitting: Adult Health

## 2023-08-15 VITALS — BP 128/86 | HR 119 | Ht 67.0 in | Wt 259.4 lb

## 2023-08-15 DIAGNOSIS — R0609 Other forms of dyspnea: Secondary | ICD-10-CM

## 2023-08-15 DIAGNOSIS — G4719 Other hypersomnia: Secondary | ICD-10-CM

## 2023-08-15 DIAGNOSIS — Z720 Tobacco use: Secondary | ICD-10-CM | POA: Insufficient documentation

## 2023-08-15 DIAGNOSIS — F17210 Nicotine dependence, cigarettes, uncomplicated: Secondary | ICD-10-CM

## 2023-08-15 DIAGNOSIS — K219 Gastro-esophageal reflux disease without esophagitis: Secondary | ICD-10-CM

## 2023-08-15 MED ORDER — PANTOPRAZOLE SODIUM 40 MG PO TBEC: 40.0000 mg | DELAYED_RELEASE_TABLET | Freq: Every day | ORAL | 1 refills | Status: AC

## 2023-08-15 NOTE — Assessment & Plan Note (Signed)
 Add PPI , GERD diet   Plan  Patient Instructions  Check on home sleep study results-may have to do redo. We will call to let you know the status.  Work on healthy weight loss  Do not drive if sleepy  Use caution with sedating medications.   Work on not smoking  Try Liquid Mucinex DM or Delsym As needed  cough Claritin daily As needed  for drainage  Saline nasal rinses Twice daily  As needed    Begin Protonix 40mg  daily  GERD diet   Follow up with in 6-8 weeks and As needed

## 2023-08-15 NOTE — Progress Notes (Signed)
 @Patient  ID: Jasmine Knapp, female    DOB: 02/06/1986, 38 y.o.   MRN: [id number]  Chief Complaint  Patient presents with   Follow-up    Referring provider: Sunny English  HPI: 38 year old female active smoker seen for March 28, 2023 for snoring and daytime sleepiness and shortness of breath. Previously diagnosed with sleep apnea CPAP intolerant  TEST/EVENTS :  PFTs May 23, 2023 FEV1 89%, ratio 75, FVC 97, DLCO 105%.  08/15/2023 Follow up ;  Patient presents for a follow-up visit.  She was seen in January 2025 for sleep consult for snoring and daytime sleepiness.  She has a previous diagnosis of sleep apnea but was CPAP intolerant due to claustrophobia.  She was set up for home sleep study but unfortunately this was not completed. She had very poor sleep and data was not adequate for download.   She has a heavy smoking history. She has cut back from 2 PPD to 3/4 PPD. Last visit with complaints of shortness of breath and cough. She was set up for pulmonary function testing that was completed in March of this year.  This showed essentially normal lung function with very minimal airflow obstruction.  FEV1 at 89%, ratio 75, FVC 97%, DLCO 105%.  Chest x-ray last visit was clear.  We discussed her test results in detail. Says breathing is doing about the same, predominate symptoms is on/off cough with clear to white mucus. Not taking any medications. Occasional post nasal drainage. Does have daily reflux, takes Tums with minimal relief.     Allergies  Allergen Reactions   Penicillins Hives   Doxycycline Other (See Comments)    Headaches  Headache    Immunization History  Administered Date(s) Administered   Hepatitis B, PED/ADOLESCENT 12/21/1995, 01/25/1996, 05/28/1996    Past Medical History:  Diagnosis Date   Anxiety    Panic disorder     Tobacco History: Social History   Tobacco Use  Smoking Status Every Day   Current packs/day: 0.50   Types: Cigarettes   Smokeless Tobacco Never   Ready to quit: Not Answered Counseling given: Not Answered   Outpatient Medications Prior to Visit  Medication Sig Dispense Refill   clindamycin (CLEOCIN) 300 MG capsule Take 300 mg by mouth every 8 (eight) hours.     No facility-administered medications prior to visit.     Review of Systems:   Constitutional:   No  weight loss, night sweats,  Fevers, chills, +fatigue, or  lassitude.  HEENT:   No headaches,  Difficulty swallowing,  Tooth/dental problems, or  Sore throat,                No sneezing, itching, ear ache,+ nasal congestion, post nasal drip,   CV:  No chest pain,  Orthopnea, PND, swelling in lower extremities, anasarca, dizziness, palpitations, syncope.   GI  No heartburn, indigestion, abdominal pain, nausea, vomiting, diarrhea, change in bowel habits, loss of appetite, bloody stools.   Resp: .  No chest wall deformity  Skin: no rash or lesions.  GU: no dysuria, change in color of urine, no urgency or frequency.  No flank pain, no hematuria   MS:  No joint pain or swelling.  No decreased range of motion.  No back pain.    Physical Exam  BP 128/86 (BP Location: Left Arm)   Pulse (!) 119   Ht 5\' 7"  (1.702 m)   Wt 259 lb 6.4 oz (117.7 kg)   SpO2 97% Comment: RA  BMI 40.63 kg/m   GEN: A/Ox3; pleasant , NAD, well nourished    HEENT:  [place]/AT,  OSE-clear, THROAT-clear, no lesions, no postnasal drip or exudate noted.   NECK:  Supple w/ fair ROM; no JVD; normal carotid impulses w/o bruits; no thyromegaly or nodules palpated; no lymphadenopathy.    RESP  Clear  P & A; w/o, wheezes/ rales/ or rhonchi. no accessory muscle use, no dullness to percussion  CARD:  RRR, no m/r/g, no peripheral edema, pulses intact, no cyanosis or clubbing.  GI:   Soft & nt; nml bowel sounds; no organomegaly or masses detected.   Musco: Warm bil, no deformities or joint swelling noted.   Neuro: alert, no focal deficits noted.    Skin: Warm, no lesions  or rashes    Lab Results:  CBC    Component Value Date/Time   WBC 8.9 09/28/2011 2348   RBC 4.50 09/28/2011 2348   HGB 13.3 09/28/2011 2348   HCT 38.5 09/28/2011 2348   PLT 308 09/28/2011 2348   MCV 85.6 09/28/2011 2348   MCH 29.6 09/28/2011 2348   MCHC 34.5 09/28/2011 2348   RDW 13.5 09/28/2011 2348   LYMPHSABS 1.7 06/03/2010 1105   MONOABS 0.6 06/03/2010 1105   EOSABS 0.1 06/03/2010 1105   BASOSABS 0.0 06/03/2010 1105    BMET    Component Value Date/Time   NA 137 09/28/2011 2348   K 3.5 09/28/2011 2348   CL 102 09/28/2011 2348   CO2 25 09/28/2011 2348   GLUCOSE 83 09/28/2011 2348   BUN 7 09/28/2011 2348   CREATININE 0.66 09/28/2011 2348   CALCIUM 9.7 09/28/2011 2348   GFRNONAA >90 09/28/2011 2348   GFRAA >90 09/28/2011 2348    BNP No results found for: "BNP"  ProBNP No results found for: "PROBNP"  Imaging: No results found.  Administration History     None          Latest Ref Rng & Units 05/23/2023    1:43 PM  PFT Results  FVC-Pre L 3.98   FVC-Predicted Pre % 97   Pre FEV1/FVC % % 75   FEV1-Pre L 2.98   FEV1-Predicted Pre % 89   DLCO uncorrected ml/min/mmHg 25.60   DLCO UNC% % 105   DLVA Predicted % 112   TLC L 5.54   TLC % Predicted % 100   RV % Predicted % 99     No results found for: "NITRICOXIDE"      Assessment & Plan:   Dyspnea Intermittent dyspnea and cough- PFT normal. Chest xray normal. Suspect upper airway cough syndrome. Treat for triggers. Smoking cessation. Cough control rx. Discussed adding Albuterol inhaler for possible Reactive airway -she is concerned about side effect profile.  Hold for now  Plan  Patient Instructions  Check on home sleep study results-may have to do redo. We will call to let you know the status.  Work on healthy weight loss  Do not drive if sleepy  Use caution with sedating medications.   Work on not smoking  Try Liquid Mucinex DM or Delsym As needed  cough Claritin daily As needed  for  drainage  Saline nasal rinses Twice daily  As needed    Begin Protonix 40mg  daily  GERD diet   Follow up with in 6-8 weeks and As needed       Excessive daytime sleepiness Discussed with our care team. Order for home sleep study-reordered as previous one -data not downloaded. Had poor sleep reported.  Plan  Patient Instructions  Check on home sleep study results-may have to do redo. We will call to let you know the status.  Work on healthy weight loss  Do not drive if sleepy  Use caution with sedating medications.   Work on not smoking  Try Liquid Mucinex DM or Delsym As needed  cough Claritin daily As needed  for drainage  Saline nasal rinses Twice daily  As needed    Begin Protonix 40mg  daily  GERD diet   Follow up with in 6-8 weeks and As needed       Tobacco abuse Smoking cessation discussed      Roena Clark, NP 08/15/2023

## 2023-08-15 NOTE — Patient Instructions (Signed)
 Check on home sleep study results-may have to do redo. We will call to let you know the status.  Work on healthy weight loss  Do not drive if sleepy  Use caution with sedating medications.   Work on not smoking  Try Liquid Mucinex DM or Delsym As needed  cough Claritin daily As needed  for drainage  Saline nasal rinses Twice daily  As needed    Begin Protonix 40mg  daily  GERD diet   Follow up with in 6-8 weeks and As needed

## 2023-08-15 NOTE — Assessment & Plan Note (Signed)
 Intermittent dyspnea and cough- PFT normal. Chest xray normal. Suspect upper airway cough syndrome. Treat for triggers. Smoking cessation. Cough control rx. Discussed adding Albuterol inhaler for possible Reactive airway -she is concerned about side effect profile.  Hold for now  Plan  Patient Instructions  Check on home sleep study results-may have to do redo. We will call to let you know the status.  Work on healthy weight loss  Do not drive if sleepy  Use caution with sedating medications.   Work on not smoking  Try Liquid Mucinex DM or Delsym As needed  cough Claritin daily As needed  for drainage  Saline nasal rinses Twice daily  As needed    Begin Protonix 40mg  daily  GERD diet   Follow up with in 6-8 weeks and As needed

## 2023-08-15 NOTE — Assessment & Plan Note (Signed)
 Discussed with our care team. Order for home sleep study-reordered as previous one -data not downloaded. Had poor sleep reported.   Plan  Patient Instructions  Check on home sleep study results-may have to do redo. We will call to let you know the status.  Work on healthy weight loss  Do not drive if sleepy  Use caution with sedating medications.   Work on not smoking  Try Liquid Mucinex DM or Delsym As needed  cough Claritin daily As needed  for drainage  Saline nasal rinses Twice daily  As needed    Begin Protonix 40mg  daily  GERD diet   Follow up with in 6-8 weeks and As needed

## 2023-08-15 NOTE — Assessment & Plan Note (Signed)
 Smoking cessation discussed

## 2023-09-14 ENCOUNTER — Encounter

## 2023-10-04 ENCOUNTER — Telehealth: Payer: Self-pay | Admitting: Primary Care

## 2023-10-04 NOTE — Telephone Encounter (Signed)
 Copied from CRM [id number]. Topic: Appointments - Scheduling Inquiry for Clinic >> Oct 04, 2023  2:06 PM Isabell A wrote: Reason for CRM: Patient calling to reschedule sleep study equipment appointment.SABRA Rushing number: [phone] >> Oct 04, 2023  2:40 PM Sherlean HERO wrote: 2x call. Left voicemail with my direct line for patient.

## 2023-10-09 ENCOUNTER — Ambulatory Visit

## 2023-10-09 DIAGNOSIS — G4719 Other hypersomnia: Secondary | ICD-10-CM

## 2023-10-11 ENCOUNTER — Telehealth: Payer: Self-pay | Admitting: Pulmonary Disease

## 2023-10-11 DIAGNOSIS — G4733 Obstructive sleep apnea (adult) (pediatric): Secondary | ICD-10-CM | POA: Diagnosis not present

## 2023-10-11 NOTE — Telephone Encounter (Signed)
 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  CPAP therapy if there is significant daytime sleepiness or other comorbidities including history of CVA or cardiac disease -If CPAP is chosen as an option of treatment auto titrating CPAP with a pressure setting of 5-15 will be appropriate  2.  Watchful waiting with emphasis on weight loss measures, sleep position modification to optimize lateral sleep, elevating the head of the bed by about 30 degrees may also help.  3.  An oral device may be fashioned for the treatment of mild sleep disordered breathing, will involve referral to dentist.   Follow-up as previously scheduled

## 2023-10-12 NOTE — Telephone Encounter (Signed)
 Has ov on 10/17/23 can discuss in detail

## 2023-10-17 ENCOUNTER — Ambulatory Visit: Admitting: Adult Health

## 2023-10-17 ENCOUNTER — Telehealth: Admitting: Adult Health

## 2023-10-17 ENCOUNTER — Encounter: Payer: Self-pay | Admitting: Adult Health

## 2023-10-17 DIAGNOSIS — R0609 Other forms of dyspnea: Secondary | ICD-10-CM

## 2023-10-17 DIAGNOSIS — K219 Gastro-esophageal reflux disease without esophagitis: Secondary | ICD-10-CM

## 2023-10-17 DIAGNOSIS — Z720 Tobacco use: Secondary | ICD-10-CM

## 2023-10-17 DIAGNOSIS — F17210 Nicotine dependence, cigarettes, uncomplicated: Secondary | ICD-10-CM

## 2023-10-17 DIAGNOSIS — G4719 Other hypersomnia: Secondary | ICD-10-CM | POA: Diagnosis not present

## 2023-10-17 DIAGNOSIS — G4733 Obstructive sleep apnea (adult) (pediatric): Secondary | ICD-10-CM

## 2023-10-17 NOTE — Patient Instructions (Addendum)
 Begin CPAP At bedtime, wear all night long.  Work on healthy weight loss  Do not drive if sleepy  Use caution with sedating medications.   Work on not smoking  Try Liquid Mucinex DM or Delsym As needed  cough Claritin daily As needed  for drainage  Saline nasal rinses Twice daily  As needed    Begin Protonix  40mg  daily  GERD diet   Follow up with in 3 months and As needed

## 2023-10-17 NOTE — Progress Notes (Signed)
 Virtual Visit via Video Note  I connected with Jasmine Knapp on 10/17/23 at 11:30 AM EDT by a video enabled telemedicine application and verified that I am speaking with the correct person using two identifiers.  Location: Patient: Home  Provider: Office    I discussed the limitations of evaluation and management by telemedicine and the availability of in person appointments. The patient expressed understanding and agreed to proceed.  History of Present Illness: Discussed the use of AI scribe software for clinical note transcription with the patient, who gave verbal consent to proceed.  History of Present Illness Jasmine Knapp is a 38 year old female who presents with restless sleep and daytime sleepiness and daytime sleepiness.  She is here to review her sleep study results.  Patient has a previous diagnosis of sleep apnea but was CPAP intolerant.  She continues have ongoing symptom burden.  She was set up for a home sleep study done on October 10, 2023.  Sleep study showed mild obstructive sleep apnea with AHI 6.8/hour and SpO2 low at 77%.  We discussed her sleep study results in detail.  She would like to retry CPAP to see if this could help her to feel better.  We went over CPAP care and usage.  She has a history of smoking and is actively trying to quit, having significantly reduced her smoking. She is concerned about the low oxygen levels recorded during her sleep study, noting that the 77% oxygen saturation was alarming to her.  Patient education given on CPAP study results and nocturnal hypoxemia.  PFTs done in March of this year showed essentially normal lung function.  Chest x-ray was clear.  Last visit she was recommended to begin Delsym for cough control.  Claritin for possible postnasal drainage and Protonix  for possible GERD component.  She says she has not started this today.  Says that her cough [place] comes and goes.  Sometimes it is congested and sometimes it is dry.  No  fever or hemoptysis.  Past Medical History:  Diagnosis Date   Anxiety    Panic disorder          Observations/Objective: March 2025 pulmonary function testing  normal lung function with very minimal airflow obstruction. FEV1 at 89%, ratio 75, FVC 97%, DLCO 105%.   Assessment and Plan: Mild obstructive sleep apnea with significant symptom burden.  Will begin CPAP therapy.  Obesity encouraged on healthy weight loss.  Upper airway cough-possible postnasal drainage/chronic rhinitis and GERD component.   Assessment & Plan Obstructive sleep apnea   Mild obstructive sleep apnea was confirmed by a home sleep study on October 10, 2023, showing 6-7 episodes per hour and oxygen desaturation into the 70s. Symptoms include restless sleep, daytime sleepiness, fatigue, and brain fog. . Although initially intimidated by CPAP, she is willing to try it to enhance her quality of life.  To begin auto CPAP 5 to 15 cm H2O.  Advise her to select a comfortable mask, possibly a smaller nasal mask if claustrophobic. Instruct her to use CPAP all night for maximum benefit. Educate her on CPAP usage and monitoring via an app. Schedule a follow-up visit in 3 months to assess CPAP efficacy and symptom improvement.  - discussed how weight can impact sleep and risk for sleep disordered breathing - discussed options to assist with weight loss: combination of diet modification, cardiovascular and strength training exercises   - had an extensive discussion regarding the adverse health consequences related to untreated sleep disordered breathing -  specifically discussed the risks for hypertension, coronary artery disease, cardiac dysrhythmias, cerebrovascular disease, and diabetes - lifestyle modification discussed   - discussed how sleep disruption can increase risk of accidents, particularly when driving - safe driving practices were discussed     Chronic cough    She has a chronic productive cough described as  wet rather than dry. Previous imaging and breathing tests were normal. Protonix was prescribed for possible reflux but has not been started. Mucinex DM or Delsym and Claritin were recommended previously. Encourage use of Mucinex DM or Delsym and Claritin for cough management. Reassess cough and treatment efficacy at the next follow-up visit.  Tobacco use   She has been working on quitting smoking and has reduced usage. Previous imaging and breathing tests were normal.   Follow Up Instructions:    I discussed the assessment and treatment plan with the patient. The patient was provided an opportunity to ask questions and all were answered. The patient agreed with the plan and demonstrated an understanding of the instructions.   The patient was advised to call back or seek an in-person evaluation if the symptoms worsen or if the condition fails to improve as anticipated.  I provided 30  minutes of non-face-to-face time during this encounter.   Madelin Stank, NP

## 2024-04-11 NOTE — Telephone Encounter (Signed)
 Accept- routine

## 2024-04-12 NOTE — Telephone Encounter (Signed)
 Yes I can see it under CareEverywhere.

## 2024-06-05 ENCOUNTER — Ambulatory Visit: Payer: Self-pay | Admitting: Nurse Practitioner
